# Patient Record
Sex: Male | Born: 1995 | Race: White | Hispanic: No | Marital: Single | Smoking: Never smoker
Health system: Southern US, Community
[De-identification: ages and names within clinical notes are randomized; demographics above are authoritative.]

## PROBLEM LIST (undated history)

## (undated) DIAGNOSIS — K5190 Ulcerative colitis, unspecified, without complications: Secondary | ICD-10-CM

## (undated) DIAGNOSIS — A498 Other bacterial infections of unspecified site: Secondary | ICD-10-CM

## (undated) DIAGNOSIS — L309 Dermatitis, unspecified: Secondary | ICD-10-CM

## (undated) HISTORY — PX: MOUTH SURGERY: SHX715

## (undated) HISTORY — DX: Other bacterial infections of unspecified site: A49.8

## (undated) HISTORY — PX: COLONOSCOPY W/ BIOPSIES: SHX1374

## (undated) HISTORY — DX: Ulcerative colitis, unspecified, without complications: K51.90

## (undated) HISTORY — DX: Dermatitis, unspecified: L30.9

---
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
## 2015-09-19 ENCOUNTER — Encounter (INDEPENDENT_AMBULATORY_CARE_PROVIDER_SITE_OTHER): Payer: Self-pay

## 2016-02-16 DIAGNOSIS — L259 Unspecified contact dermatitis, unspecified cause: Secondary | ICD-10-CM | POA: Diagnosis not present

## 2016-02-16 DIAGNOSIS — L219 Seborrheic dermatitis, unspecified: Secondary | ICD-10-CM | POA: Diagnosis not present

## 2017-02-15 DIAGNOSIS — L259 Unspecified contact dermatitis, unspecified cause: Secondary | ICD-10-CM | POA: Diagnosis not present

## 2017-02-15 DIAGNOSIS — L219 Seborrheic dermatitis, unspecified: Secondary | ICD-10-CM | POA: Diagnosis not present

## 2017-06-17 ENCOUNTER — Encounter: Payer: Self-pay | Admitting: Family Medicine

## 2017-06-17 ENCOUNTER — Ambulatory Visit (INDEPENDENT_AMBULATORY_CARE_PROVIDER_SITE_OTHER): Payer: BLUE CROSS/BLUE SHIELD

## 2017-06-17 ENCOUNTER — Ambulatory Visit: Payer: BLUE CROSS/BLUE SHIELD | Admitting: Family Medicine

## 2017-06-17 VITALS — BP 121/77 | HR 91 | Temp 98.1°F | Ht 65.5 in | Wt 141.1 lb

## 2017-06-17 DIAGNOSIS — K5900 Constipation, unspecified: Secondary | ICD-10-CM

## 2017-06-17 DIAGNOSIS — R109 Unspecified abdominal pain: Secondary | ICD-10-CM | POA: Diagnosis not present

## 2017-06-17 DIAGNOSIS — L2084 Intrinsic (allergic) eczema: Secondary | ICD-10-CM | POA: Diagnosis not present

## 2017-06-17 DIAGNOSIS — L309 Dermatitis, unspecified: Secondary | ICD-10-CM | POA: Insufficient documentation

## 2017-06-17 NOTE — Progress Notes (Signed)
BP 121/77   Pulse 91   Temp 98.1 F (36.7 C) (Oral)   Ht 5' 5.5" (1.664 m)   Wt 141 lb 2 oz (64 kg)   BMI 23.13 kg/m    Subjective:    Patient ID: Jonas Goh, male    DOB: Feb 24, 1996, 22 y.o.   MRN: [id number]  HPI: Armany Mano is a 22 y.o. male presenting on 06/17/2017 for Establish Care (constipation x 1 month plus, noticing some blood in stool, lower abdominal pain; taking Simethicone; mom concerned about IBS, diverticulitis, hemorrhoids)   HPI Constipation and blood in stool Patient comes in as a new patient to establish care with our office.  He has been having a lot of issues with constipation over the past month and recently over the past couple days has been noticing some bright red blood in his stool and he thinks he starting to have some hemorrhoids.  Patient has been taking simethicone and mom is concerned about possible IBS or diverticulitis or hemorrhoids.  She has a personal history of irritable bowel syndrome.  He denies having any abdominal pain but does feel some bloating and indigestion.  He denies any fevers or chills or nausea or vomiting.  He has been eating and drinking normally.  Patient has chronic severe eczema and sees a dermatologist for this.  Relevant past medical, surgical, family and social history reviewed and updated as indicated. Interim medical history since our last visit reviewed. Allergies and medications reviewed and updated.  Review of Systems  Constitutional: Negative for chills and fever.  Respiratory: Negative for shortness of breath and wheezing.   Cardiovascular: Negative for chest pain and leg swelling.  Gastrointestinal: Positive for abdominal distention and constipation. Negative for abdominal pain, diarrhea, nausea and vomiting.  Musculoskeletal: Negative for back pain and gait problem.  Skin: Positive for rash. Negative for color change.  All other systems reviewed and are negative.   Per HPI unless specifically indicated  above  Social History   Socioeconomic History  . Marital status: Single    Spouse name: Not on file  . Number of children: Not on file  . Years of education: Not on file  . Highest education level: Not on file  Social Needs  . Financial resource strain: Not on file  . Food insecurity - worry: Not on file  . Food insecurity - inability: Not on file  . Transportation needs - medical: Not on file  . Transportation needs - non-medical: Not on file  Occupational History  . Not on file  Tobacco Use  . Smoking status: Never Smoker  . Smokeless tobacco: Never Used  Substance and Sexual Activity  . Alcohol use: No  . Drug use: No  . Sexual activity: Not Currently  Other Topics Concern  . Not on file  Social History Narrative  . Not on file    Past Surgical History:  Procedure Laterality Date  . MOUTH SURGERY      Family History  Problem Relation Age of Onset  . Seizures Mother   . Bipolar disorder Mother   . Hyperlipidemia Father   . Bipolar disorder Brother   . Anxiety disorder Brother     Allergies as of 06/17/2017      Reactions   Eggs Or Egg-derived Products    AND MAYONNAISE   Peanut Butter Flavor    Peanut-containing Drug Products       Medication List        Accurate as of 06/17/17  10:19 AM. Always use your most recent med list.          betamethasone dipropionate 0.05 % cream Commonly known as:  DIPROLENE Apply topically 2 (two) times daily.   diphenhydrAMINE 50 MG tablet Commonly known as:  BENADRYL Take 50 mg by mouth at bedtime as needed for itching.          Objective:    BP 121/77   Pulse 91   Temp 98.1 F (36.7 C) (Oral)   Ht 5' 5.5" (1.664 m)   Wt 141 lb 2 oz (64 kg)   BMI 23.13 kg/m   Wt Readings from Last 3 Encounters:  06/17/17 141 lb 2 oz (64 kg)  09/19/12 118 lb (53.5 kg) (3 %, Z= -1.86)*   * Growth percentiles are based on CDC (Boys, 2-20 Years) data.    Physical Exam  Constitutional: He is oriented to person, place,  and time. He appears well-developed and well-nourished. No distress.  Eyes: Conjunctivae are normal. No scleral icterus.  Neck: Neck supple. No thyromegaly present.  Cardiovascular: Normal rate, regular rhythm, normal heart sounds and intact distal pulses.  No murmur heard. Pulmonary/Chest: Effort normal and breath sounds normal. No respiratory distress. He has no wheezes.  Abdominal: Soft. Bowel sounds are normal. He exhibits no distension. There is tenderness (Diffuse mild tenderness, probable firmness down and lower abdomen consistent with palpable stool). There is no rebound and no CVA tenderness.  Musculoskeletal: Normal range of motion. He exhibits no edema.  Lymphadenopathy:    He has no cervical adenopathy.  Neurological: He is alert and oriented to person, place, and time. Coordination normal.  Skin: Skin is warm and dry. No rash noted. He is not diaphoretic.  Psychiatric: He has a normal mood and affect. His behavior is normal.  Nursing note and vitals reviewed.   KUB: Moderate to significant stool in colon    Assessment & Plan:   Problem List Items Addressed This Visit      Musculoskeletal and Integument   Eczema    Other Visit Diagnoses    Constipation, unspecified constipation type    -  Primary   Relevant Orders   DG Abd 1 View (Completed)       Follow up plan: Return if symptoms worsen or fail to improve.  Caryl Pina, MD [facility] 06/17/2017, 10:19 AM

## 2017-07-01 ENCOUNTER — Telehealth: Payer: Self-pay | Admitting: Family Medicine

## 2017-07-01 NOTE — Telephone Encounter (Signed)
Lm 2/1-jhb

## 2017-07-07 NOTE — Telephone Encounter (Signed)
Pt has appt 07/14/17 for recheck, will discuss then

## 2017-07-07 NOTE — Telephone Encounter (Signed)
LMTCB

## 2017-07-14 ENCOUNTER — Encounter: Payer: Self-pay | Admitting: Family Medicine

## 2017-07-14 ENCOUNTER — Ambulatory Visit: Payer: BLUE CROSS/BLUE SHIELD | Admitting: Family Medicine

## 2017-07-14 ENCOUNTER — Telehealth: Payer: Self-pay | Admitting: Family Medicine

## 2017-07-14 ENCOUNTER — Ambulatory Visit (HOSPITAL_COMMUNITY)
Admission: RE | Admit: 2017-07-14 | Discharge: 2017-07-14 | Disposition: A | Discharge status: Home / Self Care | Payer: BLUE CROSS/BLUE SHIELD | Source: Ambulatory Visit | Attending: Family Medicine | Admitting: Family Medicine

## 2017-07-14 VITALS — BP 113/74 | HR 97 | Temp 97.3°F | Ht 65.5 in | Wt 127.0 lb

## 2017-07-14 DIAGNOSIS — R634 Abnormal weight loss: Secondary | ICD-10-CM

## 2017-07-14 DIAGNOSIS — K625 Hemorrhage of anus and rectum: Secondary | ICD-10-CM

## 2017-07-14 DIAGNOSIS — K6389 Other specified diseases of intestine: Secondary | ICD-10-CM | POA: Diagnosis not present

## 2017-07-14 DIAGNOSIS — R197 Diarrhea, unspecified: Secondary | ICD-10-CM | POA: Diagnosis not present

## 2017-07-14 DIAGNOSIS — R1084 Generalized abdominal pain: Secondary | ICD-10-CM

## 2017-07-14 DIAGNOSIS — R109 Unspecified abdominal pain: Secondary | ICD-10-CM | POA: Diagnosis not present

## 2017-07-14 DIAGNOSIS — R1030 Lower abdominal pain, unspecified: Secondary | ICD-10-CM

## 2017-07-14 LAB — CMP14+EGFR
A/G RATIO: 1.3 (ref 1.2–2.2)
ALT: 8 IU/L (ref 0–44)
AST: 9 IU/L (ref 0–40)
Albumin: 3.9 g/dL (ref 3.5–5.5)
Alkaline Phosphatase: 67 IU/L (ref 39–117)
BILIRUBIN TOTAL: 0.5 mg/dL (ref 0.0–1.2)
BUN / CREAT RATIO: 8 — AB (ref 9–20)
BUN: 8 mg/dL (ref 6–20)
CALCIUM: 9.6 mg/dL (ref 8.7–10.2)
CHLORIDE: 96 mmol/L (ref 96–106)
CO2: 26 mmol/L (ref 20–29)
Creatinine, Ser: 1.02 mg/dL (ref 0.76–1.27)
GFR calc Af Amer: 118 mL/min/{1.73_m2} (ref >59)
GFR, EST NON AFRICAN AMERICAN: 102 mL/min/{1.73_m2} (ref >59)
Globulin, Total: 3 g/dL (ref 1.5–4.5)
Glucose: 109 mg/dL — ABNORMAL HIGH (ref 65–99)
POTASSIUM: 5 mmol/L (ref 3.5–5.2)
Sodium: 132 mmol/L — ABNORMAL LOW (ref 134–144)
TOTAL PROTEIN: 6.9 g/dL (ref 6.0–8.5)

## 2017-07-14 LAB — CBC WITH DIFFERENTIAL/PLATELET
BASOS ABS: 0 10*3/uL (ref 0.0–0.2)
BASOS: 0 %
EOS (ABSOLUTE): 0.1 10*3/uL (ref 0.0–0.4)
EOS: 1 %
HEMATOCRIT: 39.8 % (ref 37.5–51.0)
Hemoglobin: 13.7 g/dL (ref 13.0–17.7)
LYMPHS: 22 %
Lymphocytes Absolute: 1.5 10*3/uL (ref 0.7–3.1)
MCH: 29.2 pg (ref 26.6–33.0)
MCHC: 34.4 g/dL (ref 31.5–35.7)
MCV: 85 fL (ref 79–97)
MONOCYTES: 18 %
Monocytes Absolute: 1.2 10*3/uL — ABNORMAL HIGH (ref 0.1–0.9)
NEUTROS PCT: 35 %
Neutrophils Absolute: 4.1 10*3/uL (ref 1.4–7.0)
PLATELETS: 330 10*3/uL (ref 150–379)
RBC: 4.69 x10E6/uL (ref 4.14–5.80)
RDW: 13.9 % (ref 12.3–15.4)
WBC: 6.9 10*3/uL (ref 3.4–10.8)

## 2017-07-14 LAB — IMMATURE CELLS: BANDS PCT: 24 %

## 2017-07-14 MED ORDER — IOPAMIDOL (ISOVUE-300) INJECTION 61%
100.0000 mL | Freq: Once | INTRAVENOUS | Status: AC | PRN
  Administered 2017-07-14: 100 mL via INTRAVENOUS

## 2017-07-14 MED ORDER — ACETAMINOPHEN-CODEINE #3 300-30 MG PO TABS: 1.0000 | ORAL_TABLET | ORAL | 0 refills | Status: DC | PRN

## 2017-07-14 MED ORDER — KETOROLAC TROMETHAMINE 60 MG/2ML IM SOLN
60.0000 mg | Freq: Once | INTRAMUSCULAR | Status: AC
  Administered 2017-07-14: 60 mg via INTRAMUSCULAR

## 2017-07-14 MED ORDER — CIPROFLOXACIN HCL 500 MG PO TABS: 500.0000 mg | ORAL_TABLET | Freq: Two times a day (BID) | ORAL | 0 refills | Status: DC

## 2017-07-14 MED ORDER — METRONIDAZOLE 500 MG PO TABS
500.0000 mg | ORAL_TABLET | Freq: Two times a day (BID) | ORAL | 0 refills | Status: DC
Start: 2017-07-14 — End: 2017-07-27

## 2017-07-14 NOTE — Telephone Encounter (Signed)
Lmtcb, placed excuse letter at front for pick up.

## 2017-07-14 NOTE — Progress Notes (Signed)
BP 113/74   Pulse 97   Temp (!) 97.3 F (36.3 C) (Oral)   Ht 5' 5.5" (1.664 m)   Wt 127 lb (57.6 kg)   BMI 20.81 kg/m    Subjective:    Patient ID: Christian Cardenas, male    DOB: April 02, 1996, 22 y.o.   MRN: [id number]  HPI: Christian Cardenas is a 22 y.o. male presenting on 07/14/2017 for Abdominal pain, rectal bleeding, hemorrhoids, weight loss, n (has lost 14 lbs since 06/17/17, liquid stool; has tried Zantac, Bentyl (from mom), took Miralax for a while but has stopped)   HPI Lower abdominal pain and rectal bleeding and weight loss Patient has had continued abdominal pain since last time when he was diagnosed with constipation and he took MiraLAX which did help clear up his constipation but since that time he has been having lower abdominal pain and rectal bleeding and hemorrhoids and he says he is lost 14 pounds in the past month.  He says that his stools have been liquid now and he has been trying Zantac and Bentyl and he took MiraLAX for a while but stopped when he started having loose stools.  He denies any fevers or chills.  The pain is across both sides of his lower abdomen and does not radiate anywhere else.  He says that his appetite has been decreased and he just does not been able to eat.  He rates the pain as moderate to severe in intensity.  He denies any vomiting but does feel nauseous at times.  Relevant past medical, surgical, family and social history reviewed and updated as indicated. Interim medical history since our last visit reviewed. Allergies and medications reviewed and updated.  Review of Systems  Constitutional: Positive for fatigue and unexpected weight change. Negative for chills and fever.  Respiratory: Negative for shortness of breath and wheezing.   Cardiovascular: Negative for chest pain and leg swelling.  Gastrointestinal: Positive for abdominal distention, abdominal pain, anal bleeding, blood in stool, diarrhea and nausea.  Genitourinary: Negative for  decreased urine volume, dysuria, flank pain and frequency.  Musculoskeletal: Negative for back pain and gait problem.  Skin: Negative for rash.  All other systems reviewed and are negative.   Per HPI unless specifically indicated above   Allergies as of 07/14/2017      Reactions   Eggs Or Egg-derived Products    AND MAYONNAISE   Peanut Butter Flavor    Peanut-containing Drug Products       Medication List        Accurate as of 07/14/17 11:52 AM. Always use your most recent med list.          betamethasone dipropionate 0.05 % cream Commonly known as:  DIPROLENE Apply topically 2 (two) times daily.   diphenhydrAMINE 50 MG tablet Commonly known as:  BENADRYL Take 50 mg by mouth at bedtime as needed for itching.          Objective:    BP 113/74   Pulse 97   Temp (!) 97.3 F (36.3 C) (Oral)   Ht 5' 5.5" (1.664 m)   Wt 127 lb (57.6 kg)   BMI 20.81 kg/m   Wt Readings from Last 3 Encounters:  07/14/17 127 lb (57.6 kg)  06/17/17 141 lb 2 oz (64 kg)  09/19/12 118 lb (53.5 kg) (3 %, Z= -1.86)*   * Growth percentiles are based on CDC (Boys, 2-20 Years) data.    Physical Exam  Constitutional: He is oriented to  person, place, and time. He appears well-developed and well-nourished. No distress.  Eyes: Conjunctivae are normal. No scleral icterus.  Neck: Neck supple. No thyromegaly present.  Cardiovascular: Normal rate, regular rhythm, normal heart sounds and intact distal pulses.  No murmur heard. Pulmonary/Chest: Effort normal and breath sounds normal. No respiratory distress. He has no wheezes. He has no rales.  Abdominal: Soft. Bowel sounds are normal. He exhibits no distension. There is tenderness in the right lower quadrant, suprapubic area and left lower quadrant. There is no rigidity, no rebound, no guarding and no CVA tenderness.  Musculoskeletal: Normal range of motion. He exhibits no edema.  Lymphadenopathy:    He has no cervical adenopathy.  Neurological: He  is alert and oriented to person, place, and time. Coordination normal.  Skin: Skin is warm and dry. No rash noted. He is not diaphoretic.  Psychiatric: He has a normal mood and affect. His behavior is normal.  Nursing note and vitals reviewed.   No results found for this or any previous visit.    Assessment & Plan:   Problem List Items Addressed This Visit    None    Visit Diagnoses    Lower abdominal pain    -  Primary   Relevant Medications   ketorolac (TORADOL) injection 60 mg (Completed)   Other Relevant Orders   CBC with Differential/Platelet (Completed)   CMP14+EGFR (Completed)   Ambulatory referral to Gastroenterology   Weight loss, unintentional       Relevant Medications   ketorolac (TORADOL) injection 60 mg (Completed)   Other Relevant Orders   CBC with Differential/Platelet (Completed)   CMP14+EGFR (Completed)   Ambulatory referral to Gastroenterology   Blood per rectum       Relevant Orders   CBC with Differential/Platelet (Completed)   CMP14+EGFR (Completed)   Ambulatory referral to Gastroenterology   Generalized abdominal pain       Relevant Medications   ketorolac (TORADOL) injection 60 mg (Completed)   Other Relevant Orders   CBC with Differential/Platelet (Completed)   CMP14+EGFR (Completed)   CT Abdomen Pelvis W Contrast (Completed)   Ambulatory referral to Gastroenterology   Abnormal weight loss       Relevant Orders   CBC with Differential/Platelet (Completed)   CMP14+EGFR (Completed)   CT Abdomen Pelvis W Contrast (Completed)   Ambulatory referral to Gastroenterology       Follow up plan: Return if symptoms worsen or fail to improve.  Counseling provided for all of the vaccine components Orders Placed This Encounter  Procedures  . CT Abdomen Pelvis W Contrast  . CBC with Differential/Platelet  . CMP14+EGFR  . Ambulatory referral to Gastroenterology    Caryl Pina, MD [facility] 07/14/2017, 11:52  AM

## 2017-07-14 NOTE — Telephone Encounter (Signed)
Discussed CAT scan results with patient, CT showed colitis and I sent in ciprofloxacin and metronidazole.  I have already placed gastroenterology referral and he would like the referral to be with Dr. Nathaneil Canary with [facility]. Please do a note for him and give him the rest of this week and through next week off to go back the following Monday Caryl Pina, MD [place] 07/14/2017, 4:24 PM

## 2017-07-15 ENCOUNTER — Encounter: Payer: Self-pay | Admitting: Gastroenterology

## 2017-07-15 NOTE — Telephone Encounter (Signed)
Patient aware.

## 2017-07-27 ENCOUNTER — Other Ambulatory Visit: Payer: BLUE CROSS/BLUE SHIELD

## 2017-07-27 ENCOUNTER — Ambulatory Visit: Payer: BLUE CROSS/BLUE SHIELD | Admitting: Gastroenterology

## 2017-07-27 ENCOUNTER — Encounter: Payer: Self-pay | Admitting: Gastroenterology

## 2017-07-27 VITALS — BP 104/62 | HR 100 | Ht 65.0 in | Wt 127.6 lb

## 2017-07-27 DIAGNOSIS — R197 Diarrhea, unspecified: Secondary | ICD-10-CM | POA: Diagnosis not present

## 2017-07-27 DIAGNOSIS — K625 Hemorrhage of anus and rectum: Secondary | ICD-10-CM | POA: Diagnosis not present

## 2017-07-27 DIAGNOSIS — R109 Unspecified abdominal pain: Secondary | ICD-10-CM | POA: Diagnosis not present

## 2017-07-27 MED ORDER — PEG 3350-KCL-NABCB-NACL-NASULF 236 G PO SOLR: 4000.0000 mL | Freq: Once | ORAL | 0 refills | Status: AC

## 2017-07-27 NOTE — Progress Notes (Signed)
07/27/2017 Christian Cardenas [id number] 01/07/1996   HISTORY OF PRESENT ILLNESS:  This is a 22 year old male who is new to our office and was referred here by Dr. Warrick Parisian for evaluation of abdominal pain, diarrhea, and rectal bleeding.  He tells me that he first started noticing some symptoms in mid-late December.  Started with some mild lower abdominal discomfort.  Then seemed to have some constipation but that resolved and now has been diarrhea, sometimes 4 times within 5 hours.  Having rectal bleeding with a moderate amount of blood in the toilet bowl at times.  Did have some nausea and vomiting around the time of the CT scan.  Reports around a 14 pound weight loss, but has gained a couple of pounds back.  Has cramping prior to a BM and has urgency with a lot of gas.  CT scan of the abdomen and pelvis on 07/14/17 showed mild wall and fold thickening throughout the colon suggesting infectious or inflammatory colitis.  No stool studies have been performed, but he was treated with a course of cipro and flagyl.  Symptoms still persistent, maybe slight improved with that treatment.  No other antibiotic use preceding this, no sick contacts.  Does handle chickens, live and dead, at work (works at [employer]).  No family history of UC/Crohn's, etc.  CBC and CMP ok except for a slightly low sodium at 132.    Past Medical History:  Diagnosis Date  . Eczema    Past Surgical History:  Procedure Laterality Date  . MOUTH SURGERY      reports that  has never smoked. he has never used smokeless tobacco. He reports that he does not drink alcohol or use drugs. family history includes Anxiety disorder in his brother; Bipolar disorder in his brother and mother; Hyperlipidemia in his father; Seizures in his mother. Allergies  Allergen Reactions  . Eggs Or Egg-Derived Products     AND MAYONNAISE  . Peanut Butter Flavor   . Peanut-Containing Drug Products       Outpatient Encounter Medications as of  07/27/2017  Medication Sig  . acetaminophen-codeine (TYLENOL #3) 300-30 MG tablet Take 1 tablet by mouth every 4 (four) hours as needed for moderate pain.  Marland Kitchen betamethasone dipropionate (DIPROLENE) 0.05 % cream Apply topically 2 (two) times daily.  . diphenhydrAMINE (BENADRYL) 50 MG tablet Take 50 mg by mouth at bedtime as needed for itching.  . [DISCONTINUED] ciprofloxacin (CIPRO) 500 MG tablet Take 1 tablet (500 mg total) by mouth 2 (two) times daily.  . [DISCONTINUED] metroNIDAZOLE (FLAGYL) 500 MG tablet Take 1 tablet (500 mg total) by mouth 2 (two) times daily.   No facility-administered encounter medications on file as of 07/27/2017.      REVIEW OF SYSTEMS  : All other systems reviewed and negative except where noted in the History of Present Illness.   PHYSICAL EXAM: BP 104/62   Pulse 100   Ht 5' 5"  (1.651 m)   Wt 127 lb 9.6 oz (57.9 kg)   BMI 21.23 kg/m  General: Well developed white male in no acute distress Head: Normocephalic and atraumatic Eyes:  Sclerae anicteric, conjunctiva pink. Ears: Normal auditory acuity Lungs: Clear throughout to auscultation; no increased WOB. Heart: Regular rate and rhythm; no M/R/G. Abdomen: Soft, non-distended.  BS present.  Mild diffuse TTP. Rectal:  Will be done at the time of colonoscopy. Musculoskeletal: Symmetrical with no gross deformities  Skin: No lesions on visible extremities Extremities: No edema  Neurological: Alert oriented x 4, grossly non-focal Psychological:  Alert and cooperative. Normal mood and affect  ASSESSMENT AND PLAN: *22 year old male with symptoms of abdominal pain, diarrhea, and rectal bleeding since December with CT scan showing colitis.  No stool studies performed but treated with cipro and flagyl.  Symptoms still persistent, slightly improved.  Need to rule out Cdiff but otherwise my concern is for IBD/UC.  Will check stool for Cdiff and if negative then will proceed with colonoscopy.  **The risks, benefits, and  alternatives to colonoscopy were discussed with the patient and he consents to proceed.   CC:  Dettinger, Fransisca Kaufmann, MD

## 2017-07-27 NOTE — Patient Instructions (Signed)
If you are age 24 or older, your body mass index should be between 23-30. Your Body mass index is 21.23 kg/m. If this is out of the aforementioned range listed, please consider follow up with your Primary Care Provider.  If you are age 52 or younger, your body mass index should be between 19-25. Your Body mass index is 21.23 kg/m. If this is out of the aformentioned range listed, please consider follow up with your Primary Care Provider.   You have been scheduled for a colonoscopy. Please follow written instructions given to you at your visit today.  Please pick up your prep supplies at the pharmacy within the next 1-3 days. If you use inhalers (even only as needed), please bring them with you on the day of your procedure. Your physician has requested that you go to www.startemmi.com and enter the access code given to you at your visit today. This web site gives a general overview about your procedure. However, you should still follow specific instructions given to you by our office regarding your preparation for the procedure.  Your physician has requested that you go to the basement for the following lab work before leaving today: C Diff  We have sent the following medications to your pharmacy for you to pick up at your convenience: Golytely  You may have a light breakfast the morning of prep day (the day before the procedure).   You may choose from one of the following items: eggs and toast OR chicken noodle soup and crackers.   You should have your breakfast completed between 8:00 and 9:00 am the day before your procedure.    After you have had your light breakfast you should start a clear liquid diet only, NO SOLIDS. No additional solid food is allowed. You may continue to have clear liquid up to 3 hours prior to your procedure.   Thank you for choosing me and [facility].   Alonza Bogus, PA-C

## 2017-07-28 NOTE — Progress Notes (Signed)
I agree with the above note, plan 

## 2017-07-31 LAB — CLOSTRIDIUM DIFFICILE BY PCR: Toxigenic C. Difficile by PCR: NEGATIVE

## 2017-08-01 ENCOUNTER — Telehealth: Payer: Self-pay | Admitting: Gastroenterology

## 2017-08-01 NOTE — Telephone Encounter (Signed)
Christian Cardenas have you reviewed the results ?

## 2017-08-02 NOTE — Telephone Encounter (Signed)
The patient has been notified of this information and all questions answered.

## 2017-08-02 NOTE — Telephone Encounter (Signed)
Cdiff stool study is negative.  Proceed with colonoscopy as scheduled.

## 2017-08-04 ENCOUNTER — Telehealth: Payer: Self-pay | Admitting: Gastroenterology

## 2017-08-04 MED ORDER — PEG 3350-KCL-NABCB-NACL-NASULF 236 G PO SOLR: 4000.0000 mL | Freq: Once | ORAL | 0 refills | Status: AC

## 2017-08-04 NOTE — Telephone Encounter (Signed)
Medication sent in. 

## 2017-08-29 ENCOUNTER — Encounter: Payer: Self-pay | Admitting: Gastroenterology

## 2017-08-30 ENCOUNTER — Ambulatory Visit: Payer: BLUE CROSS/BLUE SHIELD | Admitting: Family Medicine

## 2017-08-30 ENCOUNTER — Telehealth: Payer: Self-pay | Admitting: Gastroenterology

## 2017-08-30 ENCOUNTER — Encounter: Payer: Self-pay | Admitting: Family Medicine

## 2017-08-30 VITALS — BP 108/72 | HR 108 | Temp 98.7°F | Ht 65.0 in | Wt 115.0 lb

## 2017-08-30 DIAGNOSIS — R0981 Nasal congestion: Secondary | ICD-10-CM

## 2017-08-30 DIAGNOSIS — R109 Unspecified abdominal pain: Secondary | ICD-10-CM

## 2017-08-30 MED ORDER — ACETAMINOPHEN-CODEINE #3 300-30 MG PO TABS: 1.0000 | ORAL_TABLET | ORAL | 0 refills | Status: DC | PRN

## 2017-08-30 MED ORDER — PEG 3350-KCL-NA BICARB-NACL 420 G PO SOLR: 4000.0000 mL | Freq: Once | ORAL | 0 refills | Status: AC

## 2017-08-30 MED ORDER — FLUTICASONE PROPIONATE 50 MCG/ACT NA SUSP: 1.0000 | Freq: Two times a day (BID) | NASAL | 6 refills | Status: DC | PRN

## 2017-08-30 NOTE — Telephone Encounter (Signed)
Medication sent in again

## 2017-08-30 NOTE — Progress Notes (Signed)
BP 108/72   Pulse (!) 108   Temp 98.7 F (37.1 C) (Oral)   Ht 5' 5"  (1.651 m)   Wt 115 lb (52.2 kg)   BMI 19.14 kg/m    Subjective:    Patient ID: Christian Cardenas, male    DOB: [birth year], 22 y.o.   MRN: [id number]  HPI: Christian Cardenas is a 22 y.o. male presenting on 08/30/2017 for Fatigue, cough, needs work note (has appointment on 09/06/17 for colonoscopy) and Medication Refill (Tylenol #3)   HPI Intermittent sinus congestion and cough Patient is coming in complaining of intermittent sinus congestion cough is been going on over the past couple weeks.  He has been having some sneezing intermittently and has used some Mucinex for it which has helped some.  He says the cough is not all the time but just intermittently.  He denies any fevers or chills or shortness of breath or wheezing.  His cough is mostly nonproductive and dry.  He denies any sick contacts that he knows of.  Patient wants a pain medication refill of Tylenol 3 because of his abdominal pain.  He still has an appointment slotted to go see colonoscopy in 7 days.  Relevant past medical, surgical, family and social history reviewed and updated as indicated. Interim medical history since our last visit reviewed. Allergies and medications reviewed and updated.  Review of Systems  Constitutional: Negative for chills and fever.  HENT: Positive for congestion, sinus pressure and sneezing. Negative for ear discharge, ear pain, postnasal drip, rhinorrhea, sore throat and voice change.   Eyes: Negative for pain, discharge, redness and visual disturbance.  Respiratory: Positive for cough. Negative for shortness of breath and wheezing.   Cardiovascular: Negative for chest pain and leg swelling.  Musculoskeletal: Negative for back pain and gait problem.  Skin: Negative for rash.  Neurological: Positive for headaches.  All other systems reviewed and are negative.   Per HPI unless specifically indicated above   Allergies as of  08/30/2017      Reactions   Other Anaphylaxis   Eggs Or Egg-derived Products    AND MAYONNAISE   Peanut Butter Flavor    Peanut-containing Drug Products       Medication List        Accurate as of 08/30/17  2:52 PM. Always use your most recent med list.          acetaminophen-codeine 300-30 MG tablet Commonly known as:  TYLENOL #3 Take 1 tablet by mouth every 4 (four) hours as needed for moderate pain.   betamethasone dipropionate 0.05 % cream Commonly known as:  DIPROLENE Apply topically 2 (two) times daily.   diphenhydrAMINE 50 MG tablet Commonly known as:  BENADRYL Take 50 mg by mouth at bedtime as needed for itching.   triamcinolone cream 0.1 % Commonly known as:  KENALOG          Objective:    BP 108/72   Pulse (!) 108   Temp 98.7 F (37.1 C) (Oral)   Ht 5' 5"  (1.651 m)   Wt 115 lb (52.2 kg)   BMI 19.14 kg/m   Wt Readings from Last 3 Encounters:  08/30/17 115 lb (52.2 kg)  07/27/17 127 lb 9.6 oz (57.9 kg)  07/14/17 127 lb (57.6 kg)    Physical Exam  Constitutional: He is oriented to person, place, and time. He appears well-developed and well-nourished. No distress.  HENT:  Right Ear: Tympanic membrane, external ear and ear canal normal.  Left  Ear: Tympanic membrane, external ear and ear canal normal.  Nose: No mucosal edema, rhinorrhea or sinus tenderness. No epistaxis. Right sinus exhibits no maxillary sinus tenderness and no frontal sinus tenderness. Left sinus exhibits no maxillary sinus tenderness and no frontal sinus tenderness.  Mouth/Throat: Uvula is midline and mucous membranes are normal. No oropharyngeal exudate, posterior oropharyngeal edema, posterior oropharyngeal erythema or tonsillar abscesses.  Eyes: Pupils are equal, round, and reactive to light. Conjunctivae and EOM are normal. Right eye exhibits no discharge. No scleral icterus.  Neck: Neck supple. No thyromegaly present.  Cardiovascular: Normal rate, regular rhythm, normal heart sounds  and intact distal pulses.  No murmur heard. Pulmonary/Chest: Effort normal and breath sounds normal. No respiratory distress. He has no wheezes. He has no rales.  Abdominal: There is generalized tenderness. There is no rigidity, no rebound, no guarding and no CVA tenderness.  Musculoskeletal: Normal range of motion. He exhibits no edema.  Lymphadenopathy:    He has no cervical adenopathy.  Neurological: He is alert and oriented to person, place, and time. Coordination normal.  Skin: Skin is warm and dry. No rash noted. He is not diaphoretic.  Psychiatric: He has a normal mood and affect. His behavior is normal.  Nursing note and vitals reviewed.       Assessment & Plan:   Problem List Items Addressed This Visit      Other   Abdominal pain    Other Visit Diagnoses    Sinus congestion    -  Primary   Relevant Medications   fluticasone (FLONASE) 50 MCG/ACT nasal spray       Follow up plan: Return if symptoms worsen or fail to improve.  Counseling provided for all of the vaccine components No orders of the defined types were placed in this encounter.   Caryl Pina, MD [facility] 08/30/2017, 2:52 PM

## 2017-09-01 NOTE — Telephone Encounter (Signed)
Patient calling in wanting nurse to disregard previous message. He says pharmacy was able to process rx and was able to fill med

## 2017-09-01 NOTE — Telephone Encounter (Signed)
Patient calling again stating that pharmacy has still not received prep for colon this Tuesday 4.9.19. Patient wanting to know if it can be sent again and called when completed or if it is not going through to try the [facility] in [place].

## 2017-09-01 NOTE — Telephone Encounter (Signed)
Noted  

## 2017-09-06 ENCOUNTER — Encounter: Payer: Self-pay | Admitting: Gastroenterology

## 2017-09-06 ENCOUNTER — Other Ambulatory Visit: Payer: Self-pay

## 2017-09-06 ENCOUNTER — Ambulatory Visit (AMBULATORY_SURGERY_CENTER): Payer: BLUE CROSS/BLUE SHIELD | Admitting: Gastroenterology

## 2017-09-06 DIAGNOSIS — K52832 Lymphocytic colitis: Secondary | ICD-10-CM | POA: Diagnosis not present

## 2017-09-06 DIAGNOSIS — R109 Unspecified abdominal pain: Secondary | ICD-10-CM

## 2017-09-06 DIAGNOSIS — K5100 Ulcerative (chronic) pancolitis without complications: Secondary | ICD-10-CM

## 2017-09-06 DIAGNOSIS — K5190 Ulcerative colitis, unspecified, without complications: Secondary | ICD-10-CM | POA: Diagnosis not present

## 2017-09-06 DIAGNOSIS — K625 Hemorrhage of anus and rectum: Secondary | ICD-10-CM | POA: Diagnosis not present

## 2017-09-06 MED ORDER — PREDNISONE 10 MG PO TABS: ORAL_TABLET | ORAL | 1 refills | Status: DC

## 2017-09-06 MED ORDER — SODIUM CHLORIDE 0.9 % IV SOLN
500.0000 mL | Freq: Once | INTRAVENOUS | Status: DC
Start: 2017-09-06 — End: 2018-02-17

## 2017-09-06 NOTE — Progress Notes (Signed)
To PACU, VSS. Report to RN.tb 

## 2017-09-06 NOTE — Op Note (Signed)
[facility] Patient Name: Christian Cardenas Procedure Date: 09/06/2017 2:02 PM MRN: [id number] Endoscopist: Milus Banister , MD Age: 22 Referring MD:  Date of Birth: 1996/01/18 Gender: Male Account #: [id number] Procedure:                Colonoscopy Indications:              Chronic diarrhea, bleeding intermittently, weight                            loss; pan-colitis on CT scan Medicines:                Fentanyl 150 micrograms IV, Midazolam 8 mg IV Procedure:                Pre-Anesthesia Assessment:                           - Prior to the procedure, a History and Physical                            was performed, and patient medications and                            allergies were reviewed. The patient's tolerance of                            previous anesthesia was also reviewed. The risks                            and benefits of the procedure and the sedation                            options and risks were discussed with the patient.                            All questions were answered, and informed consent                            was obtained. Prior Anticoagulants: The patient has                            taken no previous anticoagulant or antiplatelet                            agents. ASA Grade Assessment: II - A patient with                            mild systemic disease. After reviewing the risks                            and benefits, the patient was deemed in                            satisfactory condition to undergo the procedure.  After obtaining informed consent, the colonoscope                            was passed under direct vision. Throughout the                            procedure, the patient's blood pressure, pulse, and                            oxygen saturations were monitored continuously. The                            colonoscopy was performed without difficulty. The                            patient  tolerated the procedure well. The entire                            colon was examined. The Colonoscope was introduced                            through the anus and advanced to the the terminal                            ileum. Scope In: 2:23:47 PM Scope Out: 1:88:41 PM Scope Withdrawal Time: 0 hours 9 minutes 52 seconds  Total Procedure Duration: 0 hours 13 minutes 2 seconds  Findings:                 The terminal ileum appeared normal. Biopsies were                            taken with a cold forceps for histology.                           There was moderate to severe inflammation                            throughout the colon (no rectal sparing); friable,                            ulcerated (some linear ulcerations) and edematous.                            This was biopsied (right colon and left colon) and                            sent to pathology.                           The exam was otherwise without abnormality on                            direct and retroflexion views. Complications:  No immediate complications. Estimated blood loss:                            None. Estimated Blood Loss:     Estimated blood loss: none. 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 I suspect this                            represents IBD (UC>Crohn's).                           - Await final pathology results. Recommendation:           - Patient has a contact number available for                            emergencies. The signs and symptoms of potential                            delayed complications were discussed with the                            patient. Return to normal activities tomorrow.                            Written discharge instructions were provided to the                            patient.                           - Resume previous diet.                           - Continue present medications. Please start                             prednisone; 36m pills, take 4 pills once daily for                            now (total 419mper day), disp 100, one refill.                           - Return office visit with Dr. JaArdis Hughsr extender                            in 3-4 weeks.                           - Await pathology results. Will discuss steroid                            tapering regimen and will start steroid sparing  medicine when pathology results are back; I favor                            full strength mesalamine trial first. Milus Banister, MD 09/06/2017 2:46:47 PM This report has been signed electronically.

## 2017-09-06 NOTE — Progress Notes (Signed)
Called to room to assist during endoscopic procedure.  Patient ID and intended procedure confirmed with present staff. Received instructions for my participation in the procedure from the performing physician.  

## 2017-09-06 NOTE — Progress Notes (Signed)
Gershon Crane, CRNA, notified of egg allergy.

## 2017-09-06 NOTE — Patient Instructions (Signed)
YOU HAD AN ENDOSCOPIC PROCEDURE TODAY AT [facility]:   Refer to the procedure report that was given to you for any specific questions about what was found during the examination.  If the procedure report does not answer your questions, please call your gastroenterologist to clarify.  If you requested that your care partner not be given the details of your procedure findings, then the procedure report has been included in a sealed envelope for you to review at your convenience later.  YOU SHOULD EXPECT: Some feelings of bloating in the abdomen. Passage of more gas than usual.  Walking can help get rid of the air that was put into your GI tract during the procedure and reduce the bloating. If you had a lower endoscopy (such as a colonoscopy or flexible sigmoidoscopy) you may notice spotting of blood in your stool or on the toilet paper. If you underwent a bowel prep for your procedure, you may not have a normal bowel movement for a few days.  Please Note:  You might notice some irritation and congestion in your nose or some drainage.  This is from the oxygen used during your procedure.  There is no need for concern and it should clear up in a day or so.  SYMPTOMS TO REPORT IMMEDIATELY:   Following lower endoscopy (colonoscopy or flexible sigmoidoscopy):  Excessive amounts of blood in the stool  Significant tenderness or worsening of abdominal pains  Swelling of the abdomen that is new, acute  Fever of 100F or higher  For urgent or emergent issues, a gastroenterologist can be reached at any hour by calling [phone].   DIET:  We do recommend a small meal at first, but then you may proceed to your regular diet.  Drink plenty of fluids but you should avoid alcoholic beverages for 24 hours. Try to decease the milk products in your diet.  Eat plenty of protein.  Try to avoid advil and aspirin. Maybe cut back on fiber.  ACTIVITY:  You should plan to take it easy for the rest of  today and you should NOT DRIVE or use heavy machinery until tomorrow (because of the sedation medicines used during the test).    FOLLOW UP: Our staff will call the number listed on your records the next business day following your procedure to check on you and address any questions or concerns that you may have regarding the information given to you following your procedure. If we do not reach you, we will leave a message.  However, if you are feeling well and you are not experiencing any problems, there is no need to return our call.  We will assume that you have returned to your regular daily activities without incident.  If any biopsies were taken you will be contacted by phone or by letter within the next 1-3 weeks.  Please call us at [phone] if you have not heard about the biopsies in 3 weeks.    SIGNATURES/CONFIDENTIALITY: You and/or your care partner have signed paperwork which will be entered into your electronic medical record.  These signatures attest to the fact that that the information above on your After Visit Summary has been reviewed and is understood.  Full responsibility of the confidentiality of this discharge information lies with you and/or your care-partner.  Read all of the handouts given to you by your recovery room nurse.

## 2017-09-06 NOTE — Progress Notes (Signed)
Pt's states no medical or surgical changes since previsit or office visit. 

## 2017-09-07 ENCOUNTER — Telehealth: Payer: Self-pay | Admitting: *Deleted

## 2017-09-07 NOTE — Telephone Encounter (Signed)
  Follow up Call-  Call back number 09/06/2017  Post procedure Call Back phone  # [phone]  Permission to leave phone message Yes  Some recent data might be hidden     Patient questions:  Do you have a fever, pain , or abdominal swelling? No. Pain Score  0 *  Have you tolerated food without any problems? Yes.    Have you been able to return to your normal activities? Yes.    Do you have any questions about your discharge instructions: Diet   Yes.   Medications  No. Follow up visit  No.  Do you have questions or concerns about your Care? No.  Actions: * If pain score is 4 or above: No action needed, pain <4.

## 2017-09-13 ENCOUNTER — Telehealth: Payer: Self-pay | Admitting: Gastroenterology

## 2017-09-13 NOTE — Telephone Encounter (Signed)
See results note from today.

## 2017-09-13 NOTE — Telephone Encounter (Signed)
Dr Ardis Hughs have you reviewed the path?

## 2017-10-11 ENCOUNTER — Ambulatory Visit: Payer: BLUE CROSS/BLUE SHIELD | Admitting: Gastroenterology

## 2017-10-11 ENCOUNTER — Encounter: Payer: Self-pay | Admitting: Gastroenterology

## 2017-10-11 VITALS — BP 118/78 | HR 78 | Ht 65.0 in | Wt 121.0 lb

## 2017-10-11 DIAGNOSIS — K5100 Ulcerative (chronic) pancolitis without complications: Secondary | ICD-10-CM | POA: Diagnosis not present

## 2017-10-11 MED ORDER — MESALAMINE 1.2 G PO TBEC: 4.8000 g | DELAYED_RELEASE_TABLET | Freq: Every day | ORAL | 2 refills | Status: DC

## 2017-10-11 NOTE — Progress Notes (Signed)
10/11/2017 Christian Cardenas [id number] 10/20/1995   HISTORY OF PRESENT ILLNESS:  Patient was seen on 07/27/2017 by me and had colonoscopy on 09/06/2017 by Dr. Ardis Hughs, diagnosed with pancolitis, UC.  Was started on 40 mg of prednisone at the time of colonoscopy and was told to continue that for three more weeks after biopsy results then to taper by 10 mg weekly.  It sounds like he tapered sooner than that and is only on 10 mg and should be on 20 mg as of today (should have been on 30 mg until yesterday).  Anyway, he is definitely feeling better.  Having 1-3 BMs daily with some form.  No nocturnal BM's.  Still with some small amounts of blood if he strains too hard.  Much less abdominal pain, more just gas discomfort.  He says that his weight was down to 113 pounds so he was glad to see that it was up to 121 today.      Past Medical History:  Diagnosis Date  . Eczema   . Ulcerative colitis [facility])    Past Surgical History:  Procedure Laterality Date  . COLONOSCOPY W/ BIOPSIES    . MOUTH SURGERY      reports that he has never smoked. He has never used smokeless tobacco. He reports that he does not drink alcohol or use drugs. family history includes Anxiety disorder in his brother; Bipolar disorder in his brother and mother; Hyperlipidemia in his father; Seizures in his mother. Allergies  Allergen Reactions  . Other Anaphylaxis  . Eggs Or Egg-Derived Products     AND MAYONNAISE  . Peanut Butter Flavor   . Peanut-Containing Drug Products       Outpatient Encounter Medications as of 10/11/2017  Medication Sig  . acetaminophen-codeine (TYLENOL #3) 300-30 MG tablet Take 1 tablet by mouth every 4 (four) hours as needed for moderate pain.  Marland Kitchen betamethasone dipropionate (DIPROLENE) 0.05 % cream Apply topically 2 (two) times daily.  . diphenhydrAMINE (BENADRYL) 50 MG tablet Take 50 mg by mouth at bedtime as needed for itching.  . fluticasone (FLONASE) 50 MCG/ACT nasal spray Place 1 spray into  both nostrils 2 (two) times daily as needed for allergies or rhinitis.  . predniSONE (DELTASONE) 10 MG tablet Take 10 mg by mouth daily with breakfast.  . triamcinolone cream (KENALOG) 0.1 %   . [DISCONTINUED] predniSONE (DELTASONE) 10 MG tablet Take 4 tablets once a day with food for now. (Patient taking differently: Take 1 tablets once a day with food for now.)   Facility-Administered Encounter Medications as of 10/11/2017  Medication  . 0.9 %  sodium chloride infusion     REVIEW OF SYSTEMS  : All other systems reviewed and negative except where noted in the History of Present Illness.   PHYSICAL EXAM: BP 118/78   Pulse 78   Ht 5' 5"  (1.651 m)   Wt 121 lb (54.9 kg)   BMI 20.14 kg/m  General: Well developed white male in no acute distress Head: Normocephalic and atraumatic Eyes:  Sclerae anicteric, conjunctiva pink. Ears: Normal auditory acuity. Lungs: Clear throughout to auscultation; no increased WOB. Heart: Regular rate and rhythm; no M/R/G. Abdomen: Soft, non-distended.  BS present.  Non-tender. Musculoskeletal: Symmetrical with no gross deformities  Skin: No lesions on visible extremities Extremities: No edema  Neurological: Alert oriented x 4, grossly non-focal Psychological:  Alert and cooperative. Normal mood and affect  ASSESSMENT AND PLAN: *UC, pancolitis:  Much improved on prednisone.  Sounds like  he started taper sooner than we planned but still doing better.  Will start Lialda 4.8 grams daily.  Will continue prednisone 10 mg daily for 3 more weeks to allow the Lialda to kick in.  Will call back in about one month with an update and will get him a follow-up in place for 3-4 months from now with anticipation that he does well with mesalamine.   CC:  Dettinger, Fransisca Kaufmann, MD

## 2017-10-11 NOTE — Patient Instructions (Signed)
Continue Prednisone 10 mg daily for 3 more weeks.   We have sent the following medications to your pharmacy for you to pick up at your convenience: Lialda 4.8 grams daily   Please call the office with an update of your symptoms in a month and ask for Koren Shiver, RN, BSN.

## 2017-10-11 NOTE — Progress Notes (Signed)
I agree with the above note, plan.  Thanks Jess.

## 2017-11-07 ENCOUNTER — Telehealth: Payer: Self-pay | Admitting: Gastroenterology

## 2017-11-07 DIAGNOSIS — K5100 Ulcerative (chronic) pancolitis without complications: Secondary | ICD-10-CM

## 2017-11-08 NOTE — Telephone Encounter (Signed)
Left message on machine to call back  

## 2017-11-08 NOTE — Telephone Encounter (Signed)
Pt has been taking Lialda 4.8 g daily for the past 4 weeks, and stopped prednisone 1 week ago.  Loose stools becoming an issue and a lot of gas within several days of stopping prednisone.  Some bleeding with loose stools.  Some urgency.  He is not sure if he wants to continue lialda and has only enough to cover tomorrow.  Please advise

## 2017-11-09 NOTE — Telephone Encounter (Signed)
Patient states if he hasn't heard anything by tomorrow he is going to have to start taking the prednisone due to him having accidents.

## 2017-11-09 NOTE — Telephone Encounter (Signed)
Dr Ardis Hughs please advise the pt has called back for req's

## 2017-11-09 NOTE — Telephone Encounter (Signed)
Dr. Ardis Hughs, can I get your input on this?  Should I restart prednisone.  He had pretty severe disease.  ? If he is going to need a biologic.

## 2017-11-10 ENCOUNTER — Other Ambulatory Visit: Payer: BLUE CROSS/BLUE SHIELD

## 2017-11-10 DIAGNOSIS — K5100 Ulcerative (chronic) pancolitis without complications: Secondary | ICD-10-CM

## 2017-11-10 NOTE — Telephone Encounter (Signed)
Is failing mesalamine monotherapy.  He needs to restart prednisone at 64m daily. No taper for now.  He needs stool sent for C. Diff by PCR and toxin, also GI pathogen panel.  He needs labs for tb gold, hepatitis B surface Ab, hepatitis B surface antigen, Hepatitis C Ab, HIV, varicella Ab, TPMT enzyme level.  He neesd referral to ID clinic to help get him up to date of all necessary vaccinations and will work towards new start of biologic. He needs ROV with me in next 1-2 weeks to discuss this further, decide on biologic.   thanks

## 2017-11-10 NOTE — Telephone Encounter (Signed)
The pt has been advised of the information and verbalized understanding.   The pt will call if he has not heard from ID in 1 week. He will come in for labs in the next day or two.

## 2017-11-10 NOTE — Addendum Note (Signed)
Addended by: Isaiah Serge D on: 11/10/2017 03:11 PM   Modules accepted: Orders

## 2017-11-10 NOTE — Telephone Encounter (Signed)
ID referral in Epic and ROV scheduled for 11/15/17 930 am, labs in Epic and pt notified to restart prednisone

## 2017-11-11 LAB — VARICELLA ZOSTER ABS, IGG/IGM: Varicella zoster IgG: 1381 index (ref >165)

## 2017-11-15 ENCOUNTER — Other Ambulatory Visit: Payer: Self-pay

## 2017-11-15 ENCOUNTER — Encounter: Payer: Self-pay | Admitting: Gastroenterology

## 2017-11-15 ENCOUNTER — Other Ambulatory Visit: Payer: BLUE CROSS/BLUE SHIELD

## 2017-11-15 ENCOUNTER — Ambulatory Visit: Payer: BLUE CROSS/BLUE SHIELD | Admitting: Gastroenterology

## 2017-11-15 VITALS — BP 120/62 | HR 74 | Ht 65.0 in | Wt 126.4 lb

## 2017-11-15 DIAGNOSIS — K5100 Ulcerative (chronic) pancolitis without complications: Secondary | ICD-10-CM

## 2017-11-15 DIAGNOSIS — Z23 Encounter for immunization: Secondary | ICD-10-CM

## 2017-11-15 LAB — QUANTIFERON-TB GOLD PLUS
MITOGEN-NIL: 3.38 [IU]/mL
NIL: 0.29 [IU]/mL
QUANTIFERON-TB GOLD PLUS: NEGATIVE
TB1-NIL: 0.01 IU/mL
TB2-NIL: 0.03 [IU]/mL

## 2017-11-15 LAB — HEPATITIS C ANTIBODY
Hepatitis C Ab: NONREACTIVE
SIGNAL TO CUT-OFF: 0.02 (ref <1.00)

## 2017-11-15 LAB — HEPATITIS B SURFACE ANTIGEN: Hepatitis B Surface Ag: NONREACTIVE

## 2017-11-15 LAB — HIV ANTIBODY (ROUTINE TESTING W REFLEX): HIV: NONREACTIVE

## 2017-11-15 LAB — THIOPURINE METHYLTRANSFERASE (TPMT), RBC: THIOPURINE METHYLTRANSFERASE, RBC: 13 nmol/h/mL

## 2017-11-15 LAB — HEPATITIS B SURFACE ANTIBODY,QUALITATIVE: Hep B S Ab: NONREACTIVE

## 2017-11-15 MED ORDER — PNEUMOCOCCAL 13-VAL CONJ VACC IM SUSP
0.5000 mL | Freq: Once | INTRAMUSCULAR | Status: AC
  Administered 2017-11-15: 0.5 mL via INTRAMUSCULAR

## 2017-11-15 NOTE — Patient Instructions (Addendum)
Entyvio new start (372m IV day zero, then 3066mIV week 2, then 30093mV week 6 and then 300m71m every 8 weeks).  Pneumococcus immunization (prevnar 13 now and then prevnar 23 in 8-10 weeks). Today.  HPV immunization is recommended; please contact your PCP about this. We don't have this here in our office.  Please return to see Dr. JacoArdis Hughs6 weeks. STay on prednisone 40mg79mly for now, can start SLOW taper after your first entyvio injection.  Await stool test results.  Normal BMI (Body Mass Index- based on height and weight) is between 19 and 25. Your BMI today is Body mass index is 21.03 kg/m. . PleMarland Kitchense consider follow up  regarding your BMI with your Primary Care Provider.

## 2017-11-15 NOTE — Progress Notes (Signed)
Review of pertinent gastrointestinal problems: 1. Extensive UC: Presented with abdominal pain, bloody diarrhea February 2019.  CT scan showed pan colonic inflammation.  Colonoscopy, Dr. Ardis Hughs April 2019 showed moderate to severe inflammation throughout his colon.  Biopsies from this confirmed inflammatory bowel disease without granulomas.  There was no rectal sparing.  The terminal ileum was normal.  He responded to prednisone and was started on oral full strength mesalamine.  10/2017 labs: Hepatitis B surface antigen and antibody negative, Hepatitis C Ab negative, HIV negative, TB quant gold negative.     HPI: This is a very pleasant 22 year old man who is here with his mother today.  I last saw him at the time of colonoscopy about 2 months ago.  He responded very well to prednisone 40 mg once daily and then started full strength oral mesalamine, Lialda 4 pills once daily.  As he tapered off the prednisone his abdominal cramping, bloody diarrhea returned.  He cannot recall specific dose of the prednisone where his symptoms return but he said clearly by the time he was off of the prednisone completely he had colitis symptoms again. Last week he was Restarted on prednisone 40 mg daily and his symptoms again responded quickly.  He stopped mesalamine.  he has had TB testing, hepatitis B and C testing, HIV, CL that summarized above   Chief complaint is extensive, moderate to severe ulcerative colitis  ROS: complete GI ROS as described in HPI, all other review negative.  Constitutional:  No unintentional weight loss   Past Medical History:  Diagnosis Date  . Eczema   . Ulcerative colitis [facility])     Past Surgical History:  Procedure Laterality Date  . COLONOSCOPY W/ BIOPSIES    . MOUTH SURGERY      Current Outpatient Medications  Medication Sig Dispense Refill  . betamethasone dipropionate (DIPROLENE) 0.05 % cream Apply topically 2 (two) times daily. 60 g 1  . diphenhydrAMINE  (BENADRYL) 50 MG tablet Take 50 mg by mouth at bedtime as needed for itching.    . fluticasone (FLONASE) 50 MCG/ACT nasal spray Place 1 spray into both nostrils 2 (two) times daily as needed for allergies or rhinitis. 16 g 6  . predniSONE (DELTASONE) 10 MG tablet Take 10 mg by mouth daily with breakfast.    . triamcinolone cream (KENALOG) 0.1 %   1   Current Facility-Administered Medications  Medication Dose Route Frequency Provider Last Rate Last Dose  . 0.9 %  sodium chloride infusion  500 mL Intravenous Once Milus Banister, MD        Allergies as of 11/15/2017 - Review Complete 11/15/2017  Allergen Reaction Noted  . Other Anaphylaxis 08/30/2012  . Eggs or egg-derived products  09/19/2012  . Peanut butter flavor  09/19/2012  . Peanut-containing drug products  09/19/2012    Family History  Problem Relation Age of Onset  . Seizures Mother   . Bipolar disorder Mother   . Hyperlipidemia Father   . Bipolar disorder Brother   . Anxiety disorder Brother     Social History   Socioeconomic History  . Marital status: Single    Spouse name: Not on file  . Number of children: 0  . Years of education: Not on file  . Highest education level: Not on file  Occupational History  . Occupation: Actuary    Comment: in [place]  . Financial resource strain: Not on file  . Food insecurity:    Worry: Not on  file    Inability: Not on file  . Transportation needs:    Medical: Not on file    Non-medical: Not on file  Tobacco Use  . Smoking status: Never Smoker  . Smokeless tobacco: Never Used  Substance and Sexual Activity  . Alcohol use: No  . Drug use: No  . Sexual activity: Not Currently  Lifestyle  . Physical activity:    Days per week: Not on file    Minutes per session: Not on file  . Stress: Not on file  Relationships  . Social connections:    Talks on phone: Not on file    Gets together: Not on file    Attends religious service: Not on file     Active member of club or organization: Not on file    Attends meetings of clubs or organizations: Not on file    Relationship status: Not on file  . Intimate partner violence:    Fear of current or ex partner: Not on file    Emotionally abused: Not on file    Physically abused: Not on file    Forced sexual activity: Not on file  Other Topics Concern  . Not on file  Social History Narrative  . Not on file     Physical Exam: BP 120/62   Pulse 74   Ht 5' 5"  (1.651 m)   Wt 126 lb 6 oz (57.3 kg)   BMI 21.03 kg/m  Constitutional: generally well-appearing Psychiatric: alert and oriented x3 Abdomen: soft, nontender, nondistended, no obvious ascites, no peritoneal signs, normal bowel sounds No peripheral edema noted in lower extremities  Assessment and plan: 22 y.o. male with extensive, moderate to severe ulcerative colitis  He is responsive to steroids but he was unable to taper while on full strength oral mesalamine.  I had a nice discussion with him and his mother in the office today about immunomodulators and biologic medicines for ulcerative colitis.  They understand that all suppress the immune system but in varying degrees and varying ways.  They understand that all of these increases risk for serious infections.  We discussed vaccinating him for pneumococcus and HPV and will work towards new start of entyvio  pending insurance coverage.  New start dosing regimen is as follows 300 mg IV at start, then  300 mg IV  at 2 weeks, then 300 mg IV at 6 weeks, then 300 mg IV every 8 weeks.  We are hoping to have this done an outpatient rheumatology office.  He knows not to start tapering his prednisone until after his first loading dose and then go very slowly with the taper.  He will return to see me in 6 weeks and sooner if needed.  Please see the "Patient Instructions" section for addition details about the plan.  [name], MD [facility] 11/15/2017, 9:52 AM

## 2017-11-17 LAB — GASTROINTESTINAL PATHOGEN PANEL PCR
C. DIFFICILE TOX A/B, PCR: NOT DETECTED
CAMPYLOBACTER, PCR: NOT DETECTED
Cryptosporidium, PCR: NOT DETECTED
E coli (ETEC) LT/ST PCR: NOT DETECTED
E coli (STEC) stx1/stx2, PCR: NOT DETECTED
E coli 0157, PCR: NOT DETECTED
GIARDIA LAMBLIA, PCR: NOT DETECTED
Norovirus, PCR: NOT DETECTED
ROTAVIRUS, PCR: NOT DETECTED
Salmonella, PCR: NOT DETECTED
Shigella, PCR: NOT DETECTED

## 2017-11-17 LAB — CLOSTRIDIUM DIFFICILE TOXIN B, QUALITATIVE, REAL-TIME PCR: CDIFFPCR: NOT DETECTED

## 2017-11-19 LAB — CLOSTRIDIUM DIFFICILE EIA: C DIFFICILE TOXINS A+ B, EIA: NEGATIVE

## 2017-11-21 ENCOUNTER — Telehealth: Payer: Self-pay

## 2017-11-21 NOTE — Telephone Encounter (Signed)
-----   Message from Angie Fava, LPN sent at 06/24/4830  1:22 PM EDT ----- Regarding: Christian Cardenas can you please get patient set up for [facility] new start.  He will need to start with 31m iv day zero,then 3069miv week #2,[id number]#6, then 300m14m8weeks. The patient would like to see Dr ShaiBo Merino is a rheumatologist. He will call his insurance and if unable to afford the medication he will call to see if something else is available.  Thanks, [employer]

## 2017-11-21 NOTE — Telephone Encounter (Signed)
Pt returning phone call best call back # [phone].

## 2017-11-22 NOTE — Telephone Encounter (Signed)
Left message on machine to call back  

## 2017-11-22 NOTE — Telephone Encounter (Signed)
New start dosing regimen is as follows 300 mg IV at start, then  300 mg IV  at 2 weeks, then 300 mg IV at 6 weeks, then 300 mg IV every 8 weeks.  We are hoping to have this done an outpatient rheumatology office. (Dr Estanislado Pandy) Referral has been made to Dr Estanislado Pandy and Weyman Rodney has been sent to [facility]

## 2017-11-23 ENCOUNTER — Other Ambulatory Visit: Payer: Self-pay | Admitting: Gastroenterology

## 2017-11-28 NOTE — Telephone Encounter (Signed)
I spoke with Dr Arlean Hopping office and the records are in review and may be a week or two before an appt is made.  I have notified the pt and he will call if he has not received a response by the end of next week.

## 2017-11-28 NOTE — Telephone Encounter (Signed)
Pt is wanting to follow up on IV medication best call back [phone].

## 2017-12-02 ENCOUNTER — Other Ambulatory Visit: Payer: Self-pay | Admitting: Gastroenterology

## 2017-12-16 ENCOUNTER — Telehealth: Payer: Self-pay | Admitting: Gastroenterology

## 2017-12-16 NOTE — Telephone Encounter (Signed)
Information was given to [facility] Dr Nelson Chimes last week.  Returned call to the pt Left message on machine to call back

## 2017-12-16 NOTE — Telephone Encounter (Signed)
Pt said that his insurance will cover entyvio. His pharmacy needs to know where his entyvio can be shipped. Pls call him.

## 2017-12-16 NOTE — Telephone Encounter (Signed)
The pt returned the call and will call [facility] and confirm appt and delivery info.  I have called on numerous occasions and have been told that the records are being reviewed by the MD.

## 2017-12-20 ENCOUNTER — Telehealth: Payer: Self-pay | Admitting: Gastroenterology

## 2017-12-20 ENCOUNTER — Other Ambulatory Visit: Payer: Self-pay

## 2017-12-20 DIAGNOSIS — K5100 Ulcerative (chronic) pancolitis without complications: Secondary | ICD-10-CM

## 2017-12-20 NOTE — Telephone Encounter (Signed)
Patient called and is frustrated with the lack of progress and her Entyvio infusions.  She has not heard from [facility] and can't speak with anyone at that office. I explained I was unaware that they had an infusion center.  She would like to get things going as quickly as possible.  She has agreed to a change in infusion sites to [facility].  She understands that she will get a call directly from them to schedule the infusion and that I will send a message to our pre-cert coordinator to work with her insurance for approval.

## 2017-12-20 NOTE — Telephone Encounter (Signed)
Pt would like to speak with you regarding taking another direction in regards to medication. Pls call her.

## 2017-12-28 ENCOUNTER — Other Ambulatory Visit: Payer: Self-pay | Admitting: Gastroenterology

## 2017-12-29 DIAGNOSIS — K5190 Ulcerative colitis, unspecified, without complications: Secondary | ICD-10-CM | POA: Diagnosis not present

## 2018-01-02 DIAGNOSIS — K5190 Ulcerative colitis, unspecified, without complications: Secondary | ICD-10-CM | POA: Diagnosis not present

## 2018-01-02 DIAGNOSIS — Z79899 Other long term (current) drug therapy: Secondary | ICD-10-CM | POA: Diagnosis not present

## 2018-01-03 ENCOUNTER — Ambulatory Visit (INDEPENDENT_AMBULATORY_CARE_PROVIDER_SITE_OTHER): Payer: BLUE CROSS/BLUE SHIELD | Admitting: Gastroenterology

## 2018-01-03 DIAGNOSIS — Z23 Encounter for immunization: Secondary | ICD-10-CM | POA: Diagnosis not present

## 2018-01-03 DIAGNOSIS — K5100 Ulcerative (chronic) pancolitis without complications: Secondary | ICD-10-CM

## 2018-01-06 ENCOUNTER — Telehealth: Payer: Self-pay | Admitting: Gastroenterology

## 2018-01-06 NOTE — Telephone Encounter (Signed)
The pt will be getting infusions at [facility] is aware

## 2018-01-10 ENCOUNTER — Encounter (HOSPITAL_COMMUNITY): Payer: Self-pay | Admitting: *Deleted

## 2018-01-10 ENCOUNTER — Other Ambulatory Visit: Payer: Self-pay

## 2018-01-10 ENCOUNTER — Emergency Department (HOSPITAL_COMMUNITY)
Admission: EM | Admit: 2018-01-10 | Discharge: 2018-01-10 | Disposition: A | Discharge status: Home / Self Care | Payer: BLUE CROSS/BLUE SHIELD | Attending: Emergency Medicine | Admitting: Emergency Medicine

## 2018-01-10 DIAGNOSIS — R269 Unspecified abnormalities of gait and mobility: Secondary | ICD-10-CM | POA: Diagnosis not present

## 2018-01-10 DIAGNOSIS — R110 Nausea: Secondary | ICD-10-CM | POA: Diagnosis not present

## 2018-01-10 DIAGNOSIS — I951 Orthostatic hypotension: Secondary | ICD-10-CM | POA: Diagnosis not present

## 2018-01-10 DIAGNOSIS — R251 Tremor, unspecified: Secondary | ICD-10-CM | POA: Diagnosis not present

## 2018-01-10 DIAGNOSIS — Z79899 Other long term (current) drug therapy: Secondary | ICD-10-CM | POA: Diagnosis not present

## 2018-01-10 DIAGNOSIS — R479 Unspecified speech disturbances: Secondary | ICD-10-CM | POA: Diagnosis not present

## 2018-01-10 DIAGNOSIS — Z91010 Allergy to peanuts: Secondary | ICD-10-CM | POA: Insufficient documentation

## 2018-01-10 DIAGNOSIS — R531 Weakness: Secondary | ICD-10-CM | POA: Diagnosis not present

## 2018-01-10 DIAGNOSIS — R42 Dizziness and giddiness: Secondary | ICD-10-CM | POA: Diagnosis not present

## 2018-01-10 DIAGNOSIS — R802 Orthostatic proteinuria, unspecified: Secondary | ICD-10-CM | POA: Diagnosis not present

## 2018-01-10 LAB — URINALYSIS, ROUTINE W REFLEX MICROSCOPIC
Bilirubin Urine: NEGATIVE
GLUCOSE, UA: NEGATIVE mg/dL
HGB URINE DIPSTICK: NEGATIVE
Ketones, ur: NEGATIVE mg/dL
Leukocytes, UA: NEGATIVE
Nitrite: NEGATIVE
PH: 7 (ref 5.0–8.0)
Protein, ur: NEGATIVE mg/dL
SPECIFIC GRAVITY, URINE: 1.011 (ref 1.005–1.030)

## 2018-01-10 LAB — RAPID URINE DRUG SCREEN, HOSP PERFORMED
AMPHETAMINES: NOT DETECTED
BARBITURATES: NOT DETECTED
Benzodiazepines: NOT DETECTED
Cocaine: NOT DETECTED
Opiates: NOT DETECTED
TETRAHYDROCANNABINOL: NOT DETECTED

## 2018-01-10 LAB — COMPREHENSIVE METABOLIC PANEL
ALBUMIN: 3.8 g/dL (ref 3.5–5.0)
ALT: 14 U/L (ref 0–44)
AST: 14 U/L — AB (ref 15–41)
Alkaline Phosphatase: 43 U/L (ref 38–126)
Anion gap: 7 (ref 5–15)
BILIRUBIN TOTAL: 0.8 mg/dL (ref 0.3–1.2)
BUN: 14 mg/dL (ref 6–20)
CHLORIDE: 101 mmol/L (ref 98–111)
CO2: 30 mmol/L (ref 22–32)
Calcium: 8.9 mg/dL (ref 8.9–10.3)
Creatinine, Ser: 0.78 mg/dL (ref 0.61–1.24)
GFR calc Af Amer: >60 mL/min (ref >60)
GFR calc non Af Amer: >60 mL/min (ref >60)
GLUCOSE: 89 mg/dL (ref 70–99)
Potassium: 3.9 mmol/L (ref 3.5–5.1)
Sodium: 138 mmol/L (ref 135–145)
TOTAL PROTEIN: 7 g/dL (ref 6.5–8.1)

## 2018-01-10 LAB — CBC WITH DIFFERENTIAL/PLATELET
BASOS PCT: 0 %
Basophils Absolute: 0 10*3/uL (ref 0.0–0.1)
EOS ABS: 0.4 10*3/uL (ref 0.0–0.7)
Eosinophils Relative: 5 %
HCT: 40.4 % (ref 39.0–52.0)
HEMOGLOBIN: 11.9 g/dL — AB (ref 13.0–17.0)
Lymphocytes Relative: 28 %
Lymphs Abs: 2.2 10*3/uL (ref 0.7–4.0)
MCH: 24 pg — ABNORMAL LOW (ref 26.0–34.0)
MCHC: 29.5 g/dL — AB (ref 30.0–36.0)
MCV: 81.6 fL (ref 78.0–100.0)
MONOS PCT: 10 %
Monocytes Absolute: 0.8 10*3/uL (ref 0.1–1.0)
NEUTROS PCT: 57 %
Neutro Abs: 4.5 10*3/uL (ref 1.7–7.7)
Platelets: 220 10*3/uL (ref 150–400)
RBC: 4.95 MIL/uL (ref 4.22–5.81)
RDW: 14.8 % (ref 11.5–15.5)
WBC: 8 10*3/uL (ref 4.0–10.5)

## 2018-01-10 MED ORDER — MECLIZINE HCL 25 MG PO TABS: 25.0000 mg | ORAL_TABLET | Freq: Three times a day (TID) | ORAL | 0 refills | Status: DC | PRN

## 2018-01-10 MED ORDER — MECLIZINE HCL 12.5 MG PO TABS
25.0000 mg | ORAL_TABLET | Freq: Once | ORAL | Status: AC
  Administered 2018-01-10: 25 mg via ORAL
  Filled 2018-01-10: qty 2

## 2018-01-10 MED ORDER — SODIUM CHLORIDE 0.9 % IV BOLUS
1000.0000 mL | Freq: Once | INTRAVENOUS | Status: AC
  Administered 2018-01-10: 1000 mL via INTRAVENOUS

## 2018-01-10 NOTE — ED Triage Notes (Signed)
Pt c/o bilateral leg weakness, tremors, trouble talking, difficulty standing, "walking to the left" when ambulating that started this morning when he woke up at 0900. Pt went to bed at 0300 this morning and felt normal. Pt's speech sounds as if he is stuttering. Grips equal bilaterally, sensation normal bilaterally in triage.

## 2018-01-10 NOTE — ED Provider Notes (Signed)
[facility] EMERGENCY DEPARTMENT Provider Note   CSN: [id number] Arrival date & time: 01/10/18  1426     History   Chief Complaint Chief Complaint  Patient presents with  . Weakness    HPI Christian Cardenas is a 22 y.o. male.  HPI Patient with history of ulcerative colitis presents with dizziness since waking this morning at 9 AM.  Describes the dizziness as room spinning sensation and feeling off balance.  States he feels like he may be falling to the left.  Associated with nausea.  Worse with position change.  Complains of feeling tremulous and occasional stuttering speech.  Denies previously similar symptoms.  Denies stimulant use or heavy alcohol consumption.  Denies focal weakness or numbness.  Started new IV medication, Entyvio, for ulcerative colitis 2 weeks ago. Past Medical History:  Diagnosis Date  . Eczema   . Ulcerative colitis [facility])     Patient Active Problem List   Diagnosis Date Noted  . Ulcerative pancolitis without complication ([place]) 48/06/6551  . Rectal bleeding 07/27/2017  . Abdominal pain 07/27/2017  . Diarrhea 07/27/2017  . Eczema 06/17/2017    Past Surgical History:  Procedure Laterality Date  . COLONOSCOPY W/ BIOPSIES    . MOUTH SURGERY          Home Medications    Prior to Admission medications   Medication Sig Start Date End Date Taking? Authorizing Provider  predniSONE (DELTASONE) 10 MG tablet Take 10 mg by mouth daily. 12/29/17  Yes [provider]  triamcinolone cream (KENALOG) 0.1 % Apply 1 application topically daily as needed (for irritation).  06/08/17  Yes [provider]  vedolizumab (ENTYVIO) 300 MG injection Inject 300 mg into the vein as directed.   Yes [provider]  betamethasone dipropionate (DIPROLENE) 0.05 % cream Apply topically 2 (two) times daily. Patient taking differently: Apply 1 application topically daily as needed (for irritation).  09/19/12   Hassell Done Mary-Margaret, FNP  diphenhydrAMINE  (BENADRYL) 50 MG tablet Take 50 mg by mouth at bedtime as needed for itching.    [provider]  fluticasone (FLONASE) 50 MCG/ACT nasal spray Place 1 spray into both nostrils 2 (two) times daily as needed for allergies or rhinitis. 08/30/17   Dettinger, Fransisca Kaufmann, MD  meclizine (ANTIVERT) 25 MG tablet Take 1 tablet (25 mg total) by mouth 3 (three) times daily as needed for dizziness. 01/10/18   Julianne Rice, MD    Family History Family History  Problem Relation Age of Onset  . Seizures Mother   . Bipolar disorder Mother   . Hyperlipidemia Father   . Bipolar disorder Brother   . Anxiety disorder Brother     Social History Social History   Tobacco Use  . Smoking status: Never Smoker  . Smokeless tobacco: Never Used  Substance Use Topics  . Alcohol use: No  . Drug use: No     Allergies   Peanut-containing drug products; Eggs or egg-derived products; and Peanut butter flavor   Review of Systems Review of Systems  Constitutional: Negative for chills and fever.  HENT: Negative for congestion, sinus pressure, sinus pain, sore throat and tinnitus.   Eyes: Negative for visual disturbance.  Respiratory: Negative for cough and shortness of breath.   Cardiovascular: Negative for chest pain and palpitations.  Gastrointestinal: Positive for nausea. Negative for constipation, diarrhea and vomiting.  Genitourinary: Negative for dysuria, frequency and hematuria.  Musculoskeletal: Positive for gait problem. Negative for back pain, myalgias and neck pain.  Skin: Negative  for rash and wound.  Neurological: Positive for dizziness and tremors. Negative for weakness, light-headedness, numbness and headaches.  All other systems reviewed and are negative.    Physical Exam Updated Vital Signs BP 107/74 (BP Location: Left Arm)   Pulse 93   Temp 98 F (36.7 C) (Oral)   Resp 14   Ht 5' 5"  (1.651 m)   Wt 57.6 kg   SpO2 100%   BMI 21.13 kg/m   Physical Exam  Constitutional: He  is oriented to person, place, and time. He appears well-developed and well-nourished. No distress.  HENT:  Head: Normocephalic and atraumatic.  Mouth/Throat: Oropharynx is clear and moist. No oropharyngeal exudate.  Eyes: Pupils are equal, round, and reactive to light. EOM are normal.  Fatigable horizontal nystagmus with the fast beat to the left  Neck: Normal range of motion. Neck supple. No JVD present.  No meningismus  Cardiovascular: Normal rate and regular rhythm. Exam reveals no gallop and no friction rub.  No murmur heard. Pulmonary/Chest: Effort normal and breath sounds normal. No stridor. No respiratory distress. He has no wheezes. He has no rales. He exhibits no tenderness.  Abdominal: Soft. Bowel sounds are normal. There is tenderness. There is no rebound and no guarding.  Mild diffuse abdominal tenderness without focality, rebound or guarding.  Musculoskeletal: Normal range of motion. He exhibits no edema or tenderness.  No midline thoracic or lumbar tenderness.  No CVA tenderness.  No lower extremity swelling, asymmetry or tenderness.  Lymphadenopathy:    He has no cervical adenopathy.  Neurological: He is alert and oriented to person, place, and time.  Patient is alert and oriented x3 with clear, goal oriented speech. Patient has 5/5 motor in all extremities. Sensation is intact to light touch. Bilateral finger-to-nose is normal with no signs of dysmetria.  Patient has very mild bilateral upper extremity tremor  Skin: Skin is warm and dry. Capillary refill takes less than 2 seconds. No rash noted. He is not diaphoretic. No erythema.  Psychiatric: He has a normal mood and affect. His behavior is normal.  Nursing note and vitals reviewed.    ED Treatments / Results  Labs (all labs ordered are listed, but only abnormal results are displayed) Labs Reviewed  CBC WITH DIFFERENTIAL/PLATELET - Abnormal; Notable for the following components:      Result Value   Hemoglobin 11.9 (*)     MCH 24.0 (*)    MCHC 29.5 (*)    All other components within normal limits  COMPREHENSIVE METABOLIC PANEL - Abnormal; Notable for the following components:   AST 14 (*)    All other components within normal limits  URINALYSIS, ROUTINE W REFLEX MICROSCOPIC - Abnormal; Notable for the following components:   Color, Urine STRAW (*)    All other components within normal limits  RAPID URINE DRUG SCREEN, HOSP PERFORMED    EKG None  Radiology No results found.  Procedures Procedures (including critical care time)  Medications Ordered in ED Medications  sodium chloride 0.9 % bolus 1,000 mL (0 mLs Intravenous Stopped 01/10/18 1733)  meclizine (ANTIVERT) tablet 25 mg (25 mg Oral Given 01/10/18 1538)  sodium chloride 0.9 % bolus 1,000 mL (0 mLs Intravenous Stopped 01/10/18 1920)     Initial Impression / Assessment and Plan / ED Course  I have reviewed the triage vital signs and the nursing notes.  Pertinent labs & imaging results that were available during my care of the patient were reviewed by me and considered in my  medical decision making (see chart for details).     Patient is feeling better after IV fluids and meclizine.  Hemoglobin is stable.  Patient does have elevation in heart rate with position change.  Likely symptoms due to peripheral vertigo and/or orthostasis.  Normal neurologic exam.  Advised to change positions slowly.  Make sure you are drinking plenty of fluids.  Follow-up with his primary provider.  Return precautions have been given.  Final Clinical Impressions(s) / ED Diagnoses   Final diagnoses:  Dizziness  Orthostasis    ED Discharge Orders         Ordered    meclizine (ANTIVERT) 25 MG tablet  3 times daily PRN     01/10/18 1937           Julianne Rice, MD 01/10/18 1939

## 2018-01-12 ENCOUNTER — Telehealth: Payer: Self-pay | Admitting: Gastroenterology

## 2018-01-12 NOTE — Telephone Encounter (Signed)
Left message on machine to call back  

## 2018-01-13 NOTE — Telephone Encounter (Signed)
Dr Ardis Hughs the pt states he had some dizziness, fatigue about a week after his Entyvio.  He went to the ED and was told he was dehydrated.  His next Infusion is 8/20.  Do you think this is side effects from [facility] and can he have the next infusion?

## 2018-01-16 NOTE — Telephone Encounter (Signed)
Entyvio MAY have played a role here. Should still plan on on time next infusion to see if the symptom complex repeats, hydrate a lot for 24hours prior.

## 2018-01-16 NOTE — Telephone Encounter (Signed)
The patient has been notified of this information and all questions answered. The pt has been advised of the information and verbalized understanding.    

## 2018-01-17 DIAGNOSIS — K5190 Ulcerative colitis, unspecified, without complications: Secondary | ICD-10-CM | POA: Diagnosis not present

## 2018-02-14 DIAGNOSIS — K5190 Ulcerative colitis, unspecified, without complications: Secondary | ICD-10-CM | POA: Diagnosis not present

## 2018-02-15 DIAGNOSIS — L209 Atopic dermatitis, unspecified: Secondary | ICD-10-CM | POA: Diagnosis not present

## 2018-02-15 DIAGNOSIS — L219 Seborrheic dermatitis, unspecified: Secondary | ICD-10-CM | POA: Diagnosis not present

## 2018-02-16 ENCOUNTER — Telehealth: Payer: Self-pay | Admitting: Gastroenterology

## 2018-02-16 NOTE — Telephone Encounter (Signed)
Patient wanting to speak with the nurse for an update on his symptoms and review medications.

## 2018-02-16 NOTE — Telephone Encounter (Signed)
Left message on machine to call back  

## 2018-02-17 ENCOUNTER — Encounter: Payer: Self-pay | Admitting: Gastroenterology

## 2018-02-17 ENCOUNTER — Ambulatory Visit: Payer: BLUE CROSS/BLUE SHIELD | Admitting: Gastroenterology

## 2018-02-17 ENCOUNTER — Other Ambulatory Visit (INDEPENDENT_AMBULATORY_CARE_PROVIDER_SITE_OTHER): Payer: BLUE CROSS/BLUE SHIELD

## 2018-02-17 VITALS — BP 106/62 | HR 64 | Ht 65.0 in | Wt 128.0 lb

## 2018-02-17 DIAGNOSIS — R197 Diarrhea, unspecified: Secondary | ICD-10-CM

## 2018-02-17 DIAGNOSIS — K5100 Ulcerative (chronic) pancolitis without complications: Secondary | ICD-10-CM

## 2018-02-17 DIAGNOSIS — K51918 Ulcerative colitis, unspecified with other complication: Secondary | ICD-10-CM | POA: Diagnosis not present

## 2018-02-17 LAB — CBC WITH DIFFERENTIAL/PLATELET
BASOS ABS: 0 10*3/uL (ref 0.0–0.1)
Basophils Relative: 0.8 % (ref 0.0–3.0)
EOS ABS: 0.5 10*3/uL (ref 0.0–0.7)
Eosinophils Relative: 8.6 % — ABNORMAL HIGH (ref 0.0–5.0)
HCT: 39.5 % (ref 39.0–52.0)
Hemoglobin: 12.8 g/dL — ABNORMAL LOW (ref 13.0–17.0)
LYMPHS PCT: 21.8 % (ref 12.0–46.0)
Lymphs Abs: 1.4 10*3/uL (ref 0.7–4.0)
MCHC: 32.3 g/dL (ref 30.0–36.0)
MCV: 77 fl — ABNORMAL LOW (ref 78.0–100.0)
Monocytes Absolute: 0.7 10*3/uL (ref 0.1–1.0)
Monocytes Relative: 10.4 % (ref 3.0–12.0)
NEUTROS ABS: 3.7 10*3/uL (ref 1.4–7.7)
Neutrophils Relative %: 58.4 % (ref 43.0–77.0)
PLATELETS: 262 10*3/uL (ref 150.0–400.0)
RBC: 5.13 Mil/uL (ref 4.22–5.81)
RDW: 16 % — ABNORMAL HIGH (ref 11.5–15.5)
WBC: 6.3 10*3/uL (ref 4.0–10.5)

## 2018-02-17 LAB — COMPREHENSIVE METABOLIC PANEL
ALT: 10 U/L (ref 0–53)
AST: 13 U/L (ref 0–37)
Albumin: 4.2 g/dL (ref 3.5–5.2)
Alkaline Phosphatase: 69 U/L (ref 39–117)
BILIRUBIN TOTAL: 0.4 mg/dL (ref 0.2–1.2)
BUN: 12 mg/dL (ref 6–23)
CALCIUM: 9.3 mg/dL (ref 8.4–10.5)
CHLORIDE: 103 meq/L (ref 96–112)
CO2: 33 meq/L — AB (ref 19–32)
Creatinine, Ser: 0.88 mg/dL (ref 0.40–1.50)
GFR: 112.49 mL/min (ref >60.00)
Glucose, Bld: 86 mg/dL (ref 70–99)
Potassium: 4.5 mEq/L (ref 3.5–5.1)
Sodium: 139 mEq/L (ref 135–145)
Total Protein: 7.5 g/dL (ref 6.0–8.3)

## 2018-02-17 LAB — SEDIMENTATION RATE: Sed Rate: 52 mm/hr — ABNORMAL HIGH (ref 0–15)

## 2018-02-17 NOTE — Progress Notes (Signed)
Review of pertinent gastrointestinal problems: 1. Extensive, severe UC: Presented with abdominal pain, bloody diarrhea February 2019.  CT scan showed pan colonic inflammation.  Colonoscopy, Dr. Ardis Hughs April 2019 showed moderate to severe inflammation throughout his colon.  Biopsies from this confirmed inflammatory bowel disease without granulomas.  There was no rectal sparing.  The terminal ileum was normal.  He responded to prednisone and was started on oral full strength mesalamine.  10/2017 he was clearly steroid dependent (could not wean off steroids without colitis symptoms recurring) and so decided to start entyvio  10/2017 labs: Hepatitis B surface antigen and antibody negative, Hepatitis C Ab negative, HIV negative, TB quant gold negative.   11/2017 Entyvio new start.   HPI: This is a very pleasant 22 year old man whom I last saw here in the office about 3 months ago.  After that visit he started [facility] and he just got his 6-week infusion.  His next infusion would be about 8 weeks from now.  He noticed good improvement after the first couple infusions but also did notice that as time got closer to his next infusion he started having some bleeding, urgency and diarrhea and abdominal cramping again.  This unfortunately has persisted even since his third, 6-week infusion 3 days ago.  He has cramping, bloody diarrhea, loose stools.  Chief complaint is extensive severe ulcerative colitis  ROS: complete GI ROS as described in HPI, all other review negative.  Constitutional:  No unintentional weight loss   Past Medical History:  Diagnosis Date  . Eczema   . Ulcerative colitis [name])     Past Surgical History:  Procedure Laterality Date  . COLONOSCOPY W/ BIOPSIES    . MOUTH SURGERY      Current Outpatient Medications  Medication Sig Dispense Refill  . betamethasone dipropionate (DIPROLENE) 0.05 % cream Apply topically 2 (two) times daily. (Patient taking differently: Apply 1  application topically daily as needed (for irritation). ) 60 g 1  . diphenhydrAMINE (BENADRYL) 50 MG tablet Take 50 mg by mouth at bedtime as needed for itching.    . fluticasone (FLONASE) 50 MCG/ACT nasal spray Place 1 spray into both nostrils 2 (two) times daily as needed for allergies or rhinitis. 16 g 6  . meclizine (ANTIVERT) 25 MG tablet Take 1 tablet (25 mg total) by mouth 3 (three) times daily as needed for dizziness. 30 tablet 0  . triamcinolone cream (KENALOG) 0.1 % Apply 1 application topically daily as needed (for irritation).   1  . vedolizumab (ENTYVIO) 300 MG injection Inject 300 mg into the vein as directed.     No current facility-administered medications for this visit.     Allergies as of 02/17/2018 - Review Complete 02/17/2018  Allergen Reaction Noted  . Peanut-containing drug products Anaphylaxis 09/19/2012  . Eggs or egg-derived products  09/19/2012  . Peanut butter flavor  09/19/2012    Family History  Problem Relation Age of Onset  . Seizures Mother   . Bipolar disorder Mother   . Hyperlipidemia Father   . Bipolar disorder Brother   . Anxiety disorder Brother     Social History   Socioeconomic History  . Marital status: Single    Spouse name: Not on file  . Number of children: 0  . Years of education: Not on file  . Highest education level: Not on file  Occupational History  . Occupation: Actuary    Comment: in [place]  . Financial resource strain: Not on file  .  Food insecurity:    Worry: Not on file    Inability: Not on file  . Transportation needs:    Medical: Not on file    Non-medical: Not on file  Tobacco Use  . Smoking status: Never Smoker  . Smokeless tobacco: Never Used  Substance and Sexual Activity  . Alcohol use: No  . Drug use: No  . Sexual activity: Not Currently  Lifestyle  . Physical activity:    Days per week: Not on file    Minutes per session: Not on file  . Stress: Not on file  Relationships  .  Social connections:    Talks on phone: Not on file    Gets together: Not on file    Attends religious service: Not on file    Active member of club or organization: Not on file    Attends meetings of clubs or organizations: Not on file    Relationship status: Not on file  . Intimate partner violence:    Fear of current or ex partner: Not on file    Emotionally abused: Not on file    Physically abused: Not on file    Forced sexual activity: Not on file  Other Topics Concern  . Not on file  Social History Narrative  . Not on file     Physical Exam: BP 106/62   Pulse 64   Ht 5' 5"  (1.651 m)   Wt 128 lb (58.1 kg)   BMI 21.30 kg/m  Constitutional: generally well-appearing Psychiatric: alert and oriented x3 Abdomen: soft, mildly tender throughout, nondistended, no obvious ascites, no peritoneal signs, normal bowel sounds No peripheral edema noted in lower extremities  Assessment and plan: 22 y.o. male with extensive severe ulcerative colitis  He may not be responding well to [facility].  He will get a set of labs including CBC, complete metabolic profile, sedimentation rate as well as GI pathogen panel to check for infectious issues.  If he proves to not have any infection and his symptoms are getting worse or not improving then I will consider him an Entyvio failure and I would start him on a different biologic after oral steroids.  Likely Remicade.  We will get an office visit in the books for him for 6 or 7 weeks from now but we will clearly be communicating with him sooner than that with his labs, stool test results and to check in to see how he is doing clinically.  Please see the "Patient Instructions" section for addition details about the plan.  [name], MD [facility] 02/17/2018, 3:16 PM

## 2018-02-17 NOTE — Patient Instructions (Addendum)
You will have labs checked today in the basement lab.  Please head down after you check out with the front desk  (cbc, cmet, esr, GI pathogen panel).  If you do not have infection and your bowels are not improving in next several days, we will likely have to consider other options to treat your UC.  Your provider has requested that you go to the basement level for lab work before leaving today. Press "B" on the elevator. The lab is located at the first door on the left as you exit the elevator.   Please return to see Dr. Ardis Hughs in 6-7 weeks. Please call the office in a few weeks to set up a follow up appointment [phone]  Thank you for entrusting me with your care and choosing [facility].  Dr Ardis Hughs

## 2018-02-17 NOTE — Telephone Encounter (Signed)
The pt states that Christian Cardenas was working great but recently has noticed the symptoms return prior to next infusion and has questions about what else he can do.  Cant wait for Dr Ardis Hughs next available  Scheduled to see Janett Billow on 9/27

## 2018-02-21 ENCOUNTER — Telehealth: Payer: Self-pay | Admitting: Gastroenterology

## 2018-02-21 LAB — GASTROINTESTINAL PATHOGEN PANEL PCR
C. difficile Tox A/B, PCR: NOT DETECTED
CAMPYLOBACTER, PCR: NOT DETECTED
CRYPTOSPORIDIUM, PCR: NOT DETECTED
E coli (ETEC) LT/ST PCR: NOT DETECTED
E coli (STEC) stx1/stx2, PCR: NOT DETECTED
E coli 0157, PCR: NOT DETECTED
GIARDIA LAMBLIA, PCR: NOT DETECTED
NOROVIRUS, PCR: NOT DETECTED
Rotavirus A, PCR: NOT DETECTED
SHIGELLA, PCR: NOT DETECTED
Salmonella, PCR: NOT DETECTED

## 2018-02-21 NOTE — Telephone Encounter (Signed)
The pt was given his lab results and advised as soon as we receive the stool test results Dr Ardis Hughs will make further recommendations

## 2018-02-24 ENCOUNTER — Ambulatory Visit: Payer: BLUE CROSS/BLUE SHIELD | Admitting: Gastroenterology

## 2018-02-24 ENCOUNTER — Encounter: Payer: Self-pay | Admitting: Gastroenterology

## 2018-02-24 ENCOUNTER — Other Ambulatory Visit: Payer: Self-pay

## 2018-02-24 DIAGNOSIS — K5100 Ulcerative (chronic) pancolitis without complications: Secondary | ICD-10-CM

## 2018-02-28 ENCOUNTER — Telehealth: Payer: Self-pay | Admitting: Gastroenterology

## 2018-02-28 NOTE — Telephone Encounter (Signed)
The pt will call [facility] to set up appt for Remicade.

## 2018-03-14 DIAGNOSIS — K5190 Ulcerative colitis, unspecified, without complications: Secondary | ICD-10-CM | POA: Diagnosis not present

## 2018-03-28 DIAGNOSIS — K5190 Ulcerative colitis, unspecified, without complications: Secondary | ICD-10-CM | POA: Diagnosis not present

## 2018-04-04 ENCOUNTER — Telehealth: Payer: Self-pay | Admitting: Gastroenterology

## 2018-04-04 NOTE — Telephone Encounter (Signed)
Pt wants to know if Dr. Ardis Hughs wants to see him in the office. He stated that it depended on his progress.

## 2018-04-04 NOTE — Telephone Encounter (Signed)
11/25 appt with Dr Ardis Hughs.  Will keep that appt.

## 2018-04-24 ENCOUNTER — Encounter: Payer: Self-pay | Admitting: Gastroenterology

## 2018-04-24 ENCOUNTER — Ambulatory Visit (INDEPENDENT_AMBULATORY_CARE_PROVIDER_SITE_OTHER): Payer: BLUE CROSS/BLUE SHIELD | Admitting: Gastroenterology

## 2018-04-24 ENCOUNTER — Other Ambulatory Visit (INDEPENDENT_AMBULATORY_CARE_PROVIDER_SITE_OTHER): Payer: BLUE CROSS/BLUE SHIELD

## 2018-04-24 VITALS — BP 110/60 | HR 114 | Ht 65.0 in | Wt 115.0 lb

## 2018-04-24 DIAGNOSIS — K5100 Ulcerative (chronic) pancolitis without complications: Secondary | ICD-10-CM

## 2018-04-24 LAB — CBC WITH DIFFERENTIAL/PLATELET
Basophils Absolute: 0 10*3/uL (ref 0.0–0.1)
Basophils Relative: 0.4 % (ref 0.0–3.0)
EOS ABS: 0.4 10*3/uL (ref 0.0–0.7)
Eosinophils Relative: 6.3 % — ABNORMAL HIGH (ref 0.0–5.0)
HEMATOCRIT: 34.6 % — AB (ref 39.0–52.0)
HEMOGLOBIN: 11.2 g/dL — AB (ref 13.0–17.0)
LYMPHS PCT: 25.4 % (ref 12.0–46.0)
Lymphs Abs: 1.6 10*3/uL (ref 0.7–4.0)
MCHC: 32.3 g/dL (ref 30.0–36.0)
MCV: 78 fl (ref 78.0–100.0)
Monocytes Absolute: 0.8 10*3/uL (ref 0.1–1.0)
Monocytes Relative: 13.2 % — ABNORMAL HIGH (ref 3.0–12.0)
Neutro Abs: 3.4 10*3/uL (ref 1.4–7.7)
Neutrophils Relative %: 54.7 % (ref 43.0–77.0)
PLATELETS: 298 10*3/uL (ref 150.0–400.0)
RBC: 4.44 Mil/uL (ref 4.22–5.81)
RDW: 14.2 % (ref 11.5–15.5)
WBC: 6.2 10*3/uL (ref 4.0–10.5)

## 2018-04-24 LAB — COMPREHENSIVE METABOLIC PANEL
ALBUMIN: 3.8 g/dL (ref 3.5–5.2)
ALT: 6 U/L (ref 0–53)
AST: 11 U/L (ref 0–37)
Alkaline Phosphatase: 59 U/L (ref 39–117)
BILIRUBIN TOTAL: 0.4 mg/dL (ref 0.2–1.2)
BUN: 8 mg/dL (ref 6–23)
CALCIUM: 9 mg/dL (ref 8.4–10.5)
CO2: 28 mEq/L (ref 19–32)
CREATININE: 0.88 mg/dL (ref 0.40–1.50)
Chloride: 105 mEq/L (ref 96–112)
GFR: 112.33 mL/min (ref >60.00)
Glucose, Bld: 88 mg/dL (ref 70–99)
Potassium: 4.1 mEq/L (ref 3.5–5.1)
Sodium: 139 mEq/L (ref 135–145)
Total Protein: 7.1 g/dL (ref 6.0–8.3)

## 2018-04-24 LAB — SEDIMENTATION RATE: Sed Rate: 47 mm/hr — ABNORMAL HIGH (ref 0–15)

## 2018-04-24 MED ORDER — PREDNISONE 20 MG PO TABS: ORAL_TABLET | ORAL | 0 refills | Status: DC

## 2018-04-24 NOTE — Patient Instructions (Addendum)
You will have labs checked today in the basement lab.  Please head down after you check out with the front desk  (cbc, cmet, esr).  Continue remicade infusions.  Try lactaid pills; start with 2 pills with any dairy  Prednisone 62m, take once daily for 3 weeks then decrease by 129mper week.  Please return to see Dr. JaHettie Holsteinn 4-6 weeks.  Take simethicone (gas ex) as needed for bloating, gas pains.  We have scheduled you for a follow up appointment with jeEllouise Newern 05/09/18 at 2:15pm  Your provider has requested that you go to the basement level for lab work before leaving today. Press "B" on the elevator. The lab is located at the first door on the left as you exit the elevator.  Thank you for entrusting me with your care and choosing [facility] JaArdis Hughs

## 2018-04-24 NOTE — Progress Notes (Signed)
Review of pertinent gastrointestinal problems: 1.Extensive, severe MC:[id number] with abdominal pain, bloody diarrhea February 2019. CT scan showed pan colonic inflammation. Colonoscopy, Dr. Ardis Hughs April 2019 showed moderate to severe inflammation throughout his colon. Biopsies from this confirmed inflammatory bowel disease without granulomas. There was no rectal sparing. The terminal ileum was normal. He responded to prednisone and was started on oral full strength mesalamine.  10/2017 he was clearly steroid dependent (could not wean off steroids without colitis symptoms recurring) and so decided to start entyvio  10/2017 labs: Hepatitis B surface antigen and antibody negative, Hepatitis C Ab negative, HIV negative, TB quant gold negative.  11/2017 Entyvio new start.  01/2018 seemingly non-responder to entyvio (sed rate 52, Gi path panel negative, clinically unchanged), so changed to remicade 24m/kg   HPI: This is a very pleasant 22yo man   He has had two doses of remicade, the third induction infusion is tomorrow.  He has noticed improvement: but still bothered by gas, abd discomfort.  Less overt bleeding.  Less frequency.    Still quite intolerant of dairy products  Has lost 13 pounds since visit 2 months ago.  Chief complaint is UC  ROS: complete GI ROS as described in HPI, all other review negative.  Constitutional:  No unintentional weight loss   Past Medical History:  Diagnosis Date  . Eczema   . Ulcerative colitis ([facility]     Past Surgical History:  Procedure Laterality Date  . COLONOSCOPY W/ BIOPSIES    . MOUTH SURGERY      Current Outpatient Medications  Medication Sig Dispense Refill  . betamethasone dipropionate (DIPROLENE) 0.05 % cream Apply topically 2 (two) times daily. (Patient taking differently: Apply 1 application topically daily as needed (for irritation). ) 60 g 1  . diphenhydrAMINE (BENADRYL) 50 MG tablet Take 50 mg by mouth at bedtime as needed for  itching.    . fluticasone (FLONASE) 50 MCG/ACT nasal spray Place 1 spray into both nostrils 2 (two) times daily as needed for allergies or rhinitis. 16 g 6  . meclizine (ANTIVERT) 25 MG tablet Take 1 tablet (25 mg total) by mouth 3 (three) times daily as needed for dizziness. 30 tablet 0  . triamcinolone cream (KENALOG) 0.1 % Apply 1 application topically daily as needed (for irritation).   1  . vedolizumab (ENTYVIO) 300 MG injection Inject 300 mg into the vein as directed.     No current facility-administered medications for this visit.     Allergies as of 04/24/2018 - Review Complete 04/24/2018  Allergen Reaction Noted  . Peanut-containing drug products Anaphylaxis 09/19/2012  . Eggs or egg-derived products  09/19/2012  . Peanut butter flavor  09/19/2012    Family History  Problem Relation Age of Onset  . Seizures Mother   . Bipolar disorder Mother   . Hyperlipidemia Father   . Bipolar disorder Brother   . Anxiety disorder Brother     Social History   Socioeconomic History  . Marital status: Single    Spouse name: Not on file  . Number of children: 0  . Years of education: Not on file  . Highest education level: Not on file  Occupational History  . 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 Financial resource strain: Not on file  . Food insecurity:    Worry: Not on file    Inability: Not on file  . Transportation needs:    Medical: Not on file  Non-medical: Not on file  Tobacco Use  . Smoking status: Never Smoker  . Smokeless tobacco: Never Used  Substance and Sexual Activity  . Alcohol use: No  . Drug use: No  . Sexual activity: Not Currently  Lifestyle  . Physical activity:    Days per week: Not on file    Minutes per session: Not on file  . Stress: Not on file  Relationships  . Social connections:    Talks on phone: Not on file    Gets together: Not on file    Attends religious service: Not on file    Active member of club or  organization: Not on file    Attends meetings of clubs or organizations: Not on file    Relationship status: Not on file  . Intimate partner violence:    Fear of current or ex partner: Not on file    Emotionally abused: Not on file    Physically abused: Not on file    Forced sexual activity: Not on file  Other Topics Concern  . Not on file  Social History Narrative  . Not on file     Physical Exam: BP 110/60   Pulse (!) 114   Ht _0  (1.651 m)   Wt 115 lb (52.2 kg)   BMI 19.14 kg/m  Constitutional: generally well-appearing Psychiatric: alert and oriented x3 Abdomen: soft, nontender, nondistended, no obvious ascites, no peritoneal signs, normal bowel sounds No peripheral edema noted in lower extremities  Assessment and plan: 22 y.o. male with extensive UC  He is responding to remicade but so far not dramatically. I'm going to put him on prednisone to help capture him (previously steroid responsive), may help him gain weight as well.  See regiment on pt instructions. Labs today (cbc, cmet, esr).  ROV in 4-6 weeks. Trial of lactaid pills.  Please see the "Patient Instructions" section for addition details about the plan.  Christian Loffler, MD [facility] 04/24/2018, 9:55 AM

## 2018-04-25 ENCOUNTER — Other Ambulatory Visit: Payer: Self-pay | Admitting: Gastroenterology

## 2018-04-25 DIAGNOSIS — K5190 Ulcerative colitis, unspecified, without complications: Secondary | ICD-10-CM | POA: Diagnosis not present

## 2018-04-25 DIAGNOSIS — K5100 Ulcerative (chronic) pancolitis without complications: Secondary | ICD-10-CM

## 2018-05-09 ENCOUNTER — Ambulatory Visit: Payer: BLUE CROSS/BLUE SHIELD | Admitting: Physician Assistant

## 2018-05-19 ENCOUNTER — Other Ambulatory Visit (INDEPENDENT_AMBULATORY_CARE_PROVIDER_SITE_OTHER): Payer: BLUE CROSS/BLUE SHIELD

## 2018-05-19 DIAGNOSIS — K5100 Ulcerative (chronic) pancolitis without complications: Secondary | ICD-10-CM | POA: Diagnosis not present

## 2018-05-19 LAB — COMPREHENSIVE METABOLIC PANEL
ALBUMIN: 3.9 g/dL (ref 3.5–5.2)
ALK PHOS: 46 U/L (ref 39–117)
ALT: 10 U/L (ref 0–53)
AST: 9 U/L (ref 0–37)
BILIRUBIN TOTAL: 0.5 mg/dL (ref 0.2–1.2)
BUN: 14 mg/dL (ref 6–23)
CO2: 33 mEq/L — ABNORMAL HIGH (ref 19–32)
CREATININE: 0.78 mg/dL (ref 0.40–1.50)
Calcium: 9.3 mg/dL (ref 8.4–10.5)
Chloride: 101 mEq/L (ref 96–112)
GFR: 129.03 mL/min (ref >60.00)
GLUCOSE: 123 mg/dL — AB (ref 70–99)
Potassium: 3.7 mEq/L (ref 3.5–5.1)
Sodium: 140 mEq/L (ref 135–145)
TOTAL PROTEIN: 6.8 g/dL (ref 6.0–8.3)

## 2018-05-19 LAB — CBC WITH DIFFERENTIAL/PLATELET
Basophils Absolute: 0 10*3/uL (ref 0.0–0.1)
Basophils Relative: 0.4 % (ref 0.0–3.0)
EOS ABS: 0.5 10*3/uL (ref 0.0–0.7)
Eosinophils Relative: 5 % (ref 0.0–5.0)
HCT: 36.8 % — ABNORMAL LOW (ref 39.0–52.0)
Hemoglobin: 11.6 g/dL — ABNORMAL LOW (ref 13.0–17.0)
LYMPHS ABS: 2.5 10*3/uL (ref 0.7–4.0)
Lymphocytes Relative: 23.1 % (ref 12.0–46.0)
MCHC: 31.6 g/dL (ref 30.0–36.0)
MCV: 78.6 fl (ref 78.0–100.0)
MONO ABS: 0.7 10*3/uL (ref 0.1–1.0)
MONOS PCT: 6.9 % (ref 3.0–12.0)
NEUTROS PCT: 64.6 % (ref 43.0–77.0)
Neutro Abs: 6.9 10*3/uL (ref 1.4–7.7)
PLATELETS: 274 10*3/uL (ref 150.0–400.0)
RBC: 4.68 Mil/uL (ref 4.22–5.81)
RDW: 15.5 % (ref 11.5–15.5)
WBC: 10.8 10*3/uL — ABNORMAL HIGH (ref 4.0–10.5)

## 2018-05-19 LAB — SEDIMENTATION RATE: SED RATE: 12 mm/h (ref 0–15)

## 2018-05-22 ENCOUNTER — Encounter: Payer: Self-pay | Admitting: Physician Assistant

## 2018-05-22 ENCOUNTER — Ambulatory Visit: Payer: BLUE CROSS/BLUE SHIELD | Admitting: Physician Assistant

## 2018-05-22 VITALS — BP 108/70 | HR 82 | Ht 65.0 in | Wt 120.0 lb

## 2018-05-22 DIAGNOSIS — K51011 Ulcerative (chronic) pancolitis with rectal bleeding: Secondary | ICD-10-CM

## 2018-05-22 NOTE — Patient Instructions (Addendum)
If you are age 25 or older, your body mass index should be between 23-30. Your Body mass index is 19.97 kg/m. If this is out of the aforementioned range listed, please consider follow up with your Primary Care Provider.  If you are age 54 or younger, your body mass index should be between 19-25. Your Body mass index is 19.97 kg/m. If this is out of the aformentioned range listed, please consider follow up with your Primary Care Provider.   Start VSL#3 - take one capsule twice daily.  This is over-the-counter. Available at [facility] [place] [address].  Continue Prednisone Taper.  Continue Iron.  Follow up with Dr. Ardis Hughs in 2-3 months. We will contact you with an appointment when the schedule becomes available.  Thank you for choosing me and [facility].    Christian Newer, PA-C

## 2018-05-22 NOTE — Progress Notes (Signed)
I agree with the above note, plan 

## 2018-05-22 NOTE — Progress Notes (Signed)
Chief Complaint: Follow-up ulcerative pancolitis  Review of pertinent gastrointestinal problems: 1. Extensive, severe UC: Presented with abdominal pain, bloody diarrhea February 2019.  CT scan showed pan colonic inflammation.  Colonoscopy, Dr. Ardis Cardenas April 2019 showed moderate to severe inflammation throughout his colon.  Biopsies from this confirmed inflammatory bowel disease without granulomas.  There was no rectal sparing.  The terminal ileum was normal.  He responded to prednisone and was started on oral full strength mesalamine.  10/2017 he was clearly steroid dependent (could not wean off steroids without colitis symptoms recurring) and so decided to start entyvio  10/2017 labs: Hepatitis B surface antigen and antibody negative, Hepatitis C Ab negative, HIV negative, TB quant gold negative.   11/2017 Entyvio new start.  01/2018 seemingly non-responder to entyvio (sed rate 52, Gi path panel negative, clinically unchanged), so changed to remicade 38m/kg  HPI:    Mr. Christian Cardenas a 22year old male with a past medical history as listed below including ulcerative pancolitis, known to Dr. JArdis Cardenas who presents to clinic today for follow-up.    04/24/2018 office visit with Dr. JArdis Cardenas  At that time had received 2 doses of Remicade, third induction infusion was 04/24/2018.  He noticed improvement but was still bothered by gas and abdominal discomfort with less overt bleeding and less frequency.  Weight loss of 13 pounds since visit 2 months ago.  It was discussed the patient was responding to Remicade but so far not dramatically.  He was placed on Prednisone 40 mg, take months for 3 weeks then decrease by 10 mg/week.  To help capture him (previously steroid responsive), also discussed it may help him gain weight.  Also discussed trial of Lactaid pills.    05/19/2018 CBC looked normal in comparison  To last other than increase in white count at 10.8, likely related to Prednisone, hgb improving.  ESR normal  at 12 (elevated at 47 4 weeks ago).  CMP was normal.     Weight gain of 5 pounds since last visit.    Today, the patient is somewhat hard to follow but tells me that 4-5 out of 7 days of the week are good as far as bowel movements and no rectal bleeding and little discomfort.  Patient tells me that the other days he will have some loose rushing stools or some "worm looking the stool" with occasional bleeding which is "definitely toned down since starting Prednisone".  Does continue Lactaid which helps.  Also discusses that he had a setback when he was undergoing a lot of stress with his brother's health.  Also discusses his brother was diagnosed with C. difficile during his hospitalization.  Next Remicade dose is January 21.  Currently on Prednisone 20 mg daily which he just started today.    Denies fever, chills, weight loss, anorexia, nausea, vomiting, heartburn, reflux or symptoms that awaken him from sleep.  Past Medical History:  Diagnosis Date  . Eczema   . Ulcerative colitis ([facility]     Past Surgical History:  Procedure Laterality Date  . COLONOSCOPY W/ BIOPSIES    . MOUTH SURGERY      Current Outpatient Medications  Medication Sig Dispense Refill  . betamethasone dipropionate (DIPROLENE) 0.05 % cream Apply topically 2 (two) times daily. (Patient taking differently: Apply 1 application topically daily as needed (for irritation). ) 60 g 1  . diphenhydrAMINE (BENADRYL) 50 MG tablet Take 50 mg by mouth at bedtime as needed for itching.    . fluticasone (FLONASE) 50 MCG/ACT  nasal spray Place 1 spray into both nostrils 2 (two) times daily as needed for allergies or rhinitis. 16 g 6  . meclizine (ANTIVERT) 25 MG tablet Take 1 tablet (25 mg total) by mouth 3 (three) times daily as needed for dizziness. 30 tablet 0  . predniSONE (DELTASONE) 20 MG tablet Take prednisone 30m daily for 3 weeks,then decrease by175mper week 120 tablet 0  . triamcinolone cream (KENALOG) 0.1 % Apply 1 application  topically daily as needed (for irritation).   1  . vedolizumab (ENTYVIO) 300 MG injection Inject 300 mg into the vein as directed.     No current facility-administered medications for this visit.     Allergies as of 05/22/2018 - Review Complete 05/22/2018  Allergen Reaction Noted  . Peanut-containing drug products Anaphylaxis 09/19/2012  . Eggs or egg-derived products  09/19/2012  . Peanut butter flavor  09/19/2012    Family History  Problem Relation Age of Onset  . Seizures Mother   . Bipolar disorder Mother   . Hyperlipidemia Father   . Bipolar disorder Brother   . Anxiety disorder Brother     Social History   Socioeconomic History  . Marital status: Single    Spouse name: Not on file  . Number of children: 0  . Years of education: Not on file  . Highest education level: Not on file  Occupational History  . 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 Financial resource strain: Not on file  . Food insecurity:    Worry: Not on file    Inability: Not on file  . Transportation needs:    Medical: Not on file    Non-medical: Not on file  Tobacco Use  . Smoking status: Never Smoker  . Smokeless tobacco: Never Used  Substance and Sexual Activity  . Alcohol use: No  . Drug use: No  . Sexual activity: Not Currently  Lifestyle  . Physical activity:    Days per week: Not on file    Minutes per session: Not on file  . Stress: Not on file  Relationships  . Social connections:    Talks on phone: Not on file    Gets together: Not on file    Attends religious service: Not on file    Active member of club or organization: Not on file    Attends meetings of clubs or organizations: Not on file    Relationship status: Not on file  . Intimate partner violence:    Fear of current or ex partner: Not on file    Emotionally abused: Not on file    Physically abused: Not on file    Forced sexual activity: Not on file  Other Topics Concern  . Not on file   Social History Narrative  . Not on file    Review of Systems:    Constitutional: No weight loss, fever or chills Cardiovascular: No chest pain Respiratory: No SOB  Gastrointestinal: See HPI and otherwise negative   Physical Exam:  Vital signs: BP 108/70   Pulse 82   Ht 5' 5" (1.651 m)   Wt 120 lb (54.4 kg)   BMI 19.97 kg/m   Constitutional:   Pleasant Caucasian male appears to be in NAD, Well developed, Well nourished, alert and cooperative Respiratory: Respirations even and unlabored. Lungs clear to auscultation bilaterally.   No wheezes, crackles, or rhonchi.  Cardiovascular: Normal S1, S2. No MRG. Regular rate and rhythm. No peripheral edema,  cyanosis or pallor.  Gastrointestinal:  Soft, nondistended, nontender. No rebound or guarding. Normal bowel sounds. No appreciable masses or hepatomegaly. Rectal:  Not performed.  Psychiatric: Demonstrates good judgement and reason without abnormal affect or behaviors.  RELEVANT LABS AND IMAGING: CBC    Component Value Date/Time   WBC 10.8 (H) 05/19/2018 1303   RBC 4.68 05/19/2018 1303   HGB 11.6 (L) 05/19/2018 1303   HGB 13.7 07/14/2017 1200   HCT 36.8 (L) 05/19/2018 1303   HCT 39.8 07/14/2017 1200   PLT 274.0 05/19/2018 1303   PLT 330 07/14/2017 1200   MCV 78.6 05/19/2018 1303   MCV 85 07/14/2017 1200   MCH 24.0 (L) 01/10/2018 1541   MCHC 31.6 05/19/2018 1303   RDW 15.5 05/19/2018 1303   RDW 13.9 07/14/2017 1200   LYMPHSABS 2.5 05/19/2018 1303   LYMPHSABS 1.5 07/14/2017 1200   MONOABS 0.7 05/19/2018 1303   EOSABS 0.5 05/19/2018 1303   EOSABS 0.1 07/14/2017 1200   BASOSABS 0.0 05/19/2018 1303   BASOSABS 0.0 07/14/2017 1200    CMP     Component Value Date/Time   NA 140 05/19/2018 1303   NA 132 (L) 07/14/2017 1200   K 3.7 05/19/2018 1303   CL 101 05/19/2018 1303   CO2 33 (H) 05/19/2018 1303   GLUCOSE 123 (H) 05/19/2018 1303   BUN 14 05/19/2018 1303   BUN 8 07/14/2017 1200   CREATININE 0.78 05/19/2018 1303    CALCIUM 9.3 05/19/2018 1303   PROT 6.8 05/19/2018 1303   PROT 6.9 07/14/2017 1200   ALBUMIN 3.9 05/19/2018 1303   ALBUMIN 3.9 07/14/2017 1200   AST 9 05/19/2018 1303   ALT 10 05/19/2018 1303   ALKPHOS 46 05/19/2018 1303   BILITOT 0.5 05/19/2018 1303   BILITOT 0.5 07/14/2017 1200   GFRNONAA >60 01/10/2018 1541   GFRAA >60 01/10/2018 1541    Assessment: 1.  Ulcerative pancolitis with complication: Patient symptoms are about 80% better currently, tapering down on Prednisone currently on 20 mg daily, next Remicade due January 21  Plan: 1.  Discussed with patient that he may have some IBS on top of his ulcerative colitis.  Would recommend he start VSL #3 1 capsule twice daily. 2.  Patient to continue his prednisone taper, 20 mg for a week and then down by 10 mg next week and then stop. 3.  Patient should continue his iron supplementation as well as Lactaid. 4.  Patient was scheduled to follow-up with Dr. Ardis Cardenas in the clinic in 2 to 3 months.  Ellouise Newer, PA-C [facility] 05/22/2018, 10:22 AM  Cc: Dettinger, Fransisca Kaufmann, MD

## 2018-05-31 DIAGNOSIS — A498 Other bacterial infections of unspecified site: Secondary | ICD-10-CM

## 2018-05-31 HISTORY — DX: Other bacterial infections of unspecified site: A49.8

## 2018-06-03 IMAGING — CT CT ABD-PELV W/ CM
2 of 4 series · 17 of 46 positions shown, 19 images · IV contrast (Isovue)
Comparison: None.

CLINICAL DATA: Weight loss, generalized abdominal pain.  Diarrhea.

EXAM:
CT ABDOMEN AND PELVIS WITH CONTRAST
TECHNIQUE: Multidetector CT imaging of the abdomen and pelvis was performed
using the standard protocol following bolus administration of
intravenous contrast.
CONTRAST:  100mL CF8PF2-FJJ IOPAMIDOL (CF8PF2-FJJ) INJECTION 61%

[Series 2: axial st · axial · 0.62mm/px · z∈[+1078,+1463]mm · 14 of 85 slices shown, 16 images]
[im 4/85  soft-tissue]
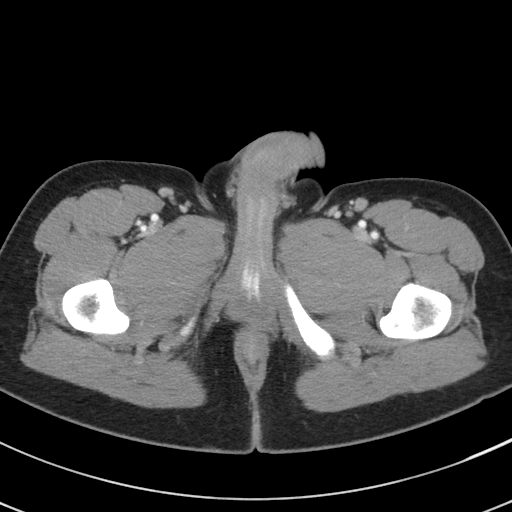
[im 4/85  bone]
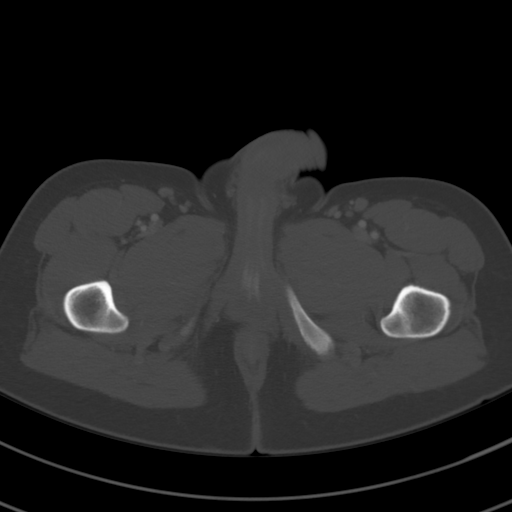
[im 12/85  soft-tissue]
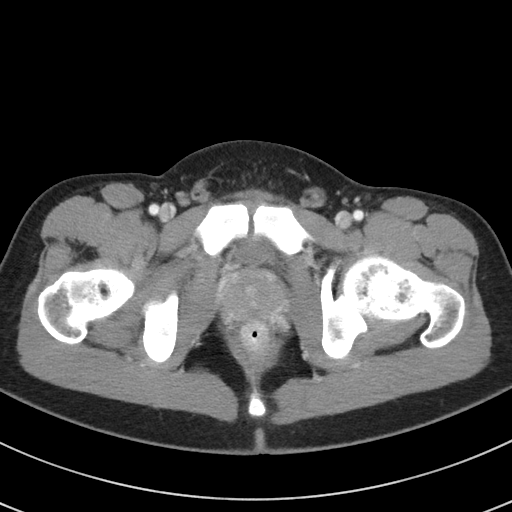
[im 16/85  soft-tissue]
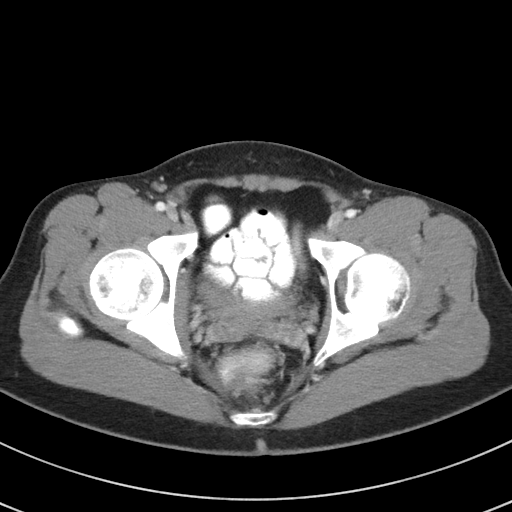
[im 23/85  soft-tissue]
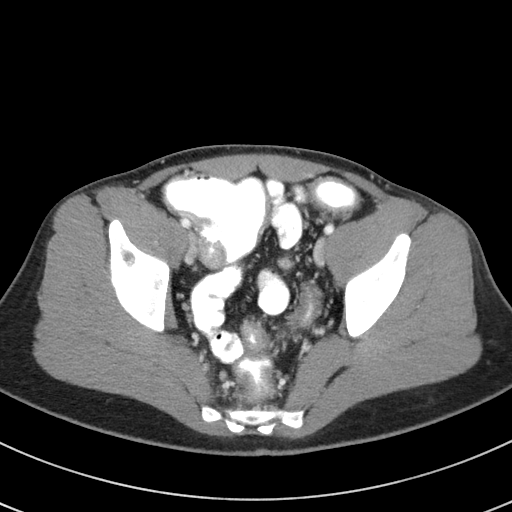
[im 27/85  soft-tissue]
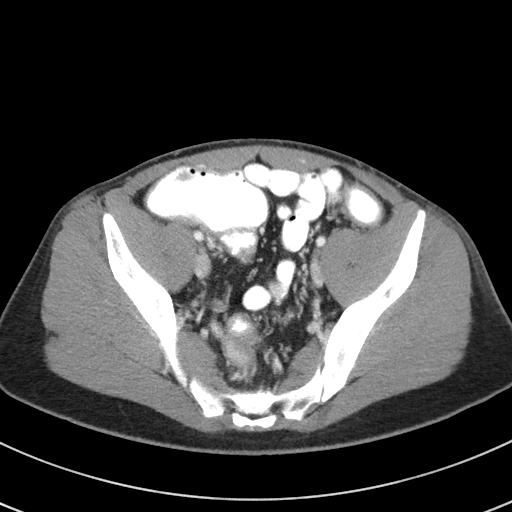
[im 35/85  soft-tissue]
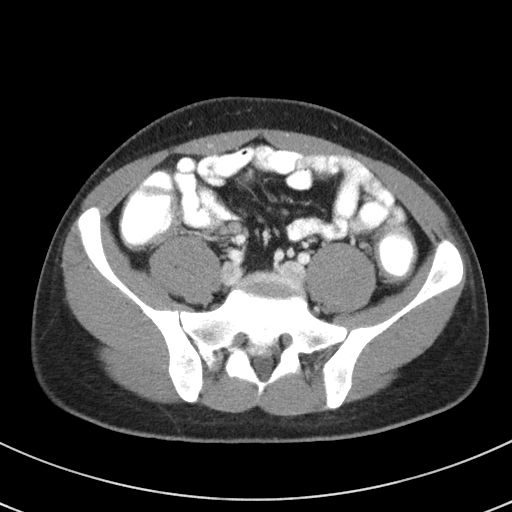
[im 39/85  soft-tissue]
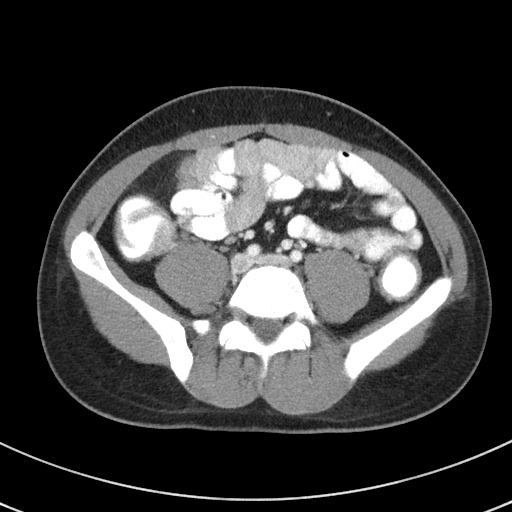
[im 46/85  soft-tissue]
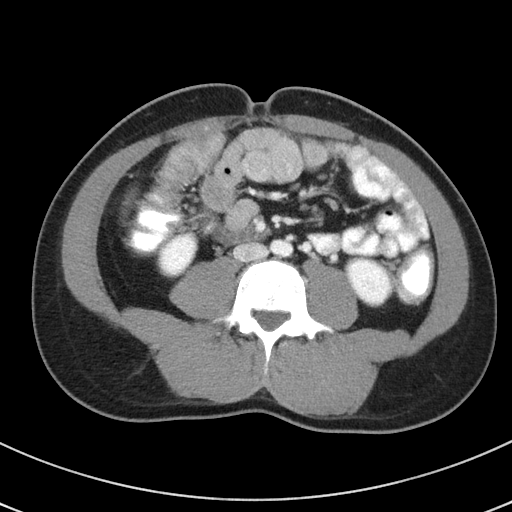
[im 50/85  soft-tissue]
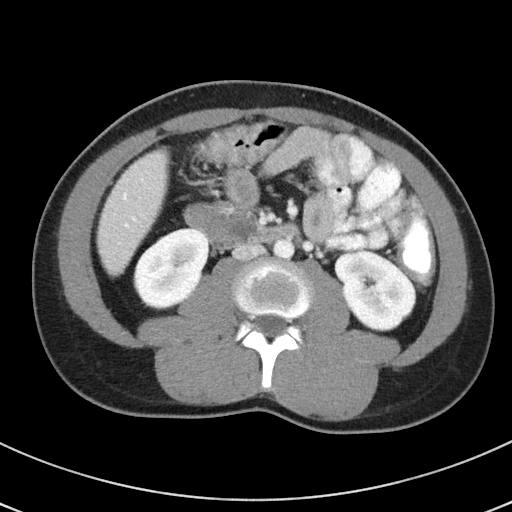
[im 50/85  bone]
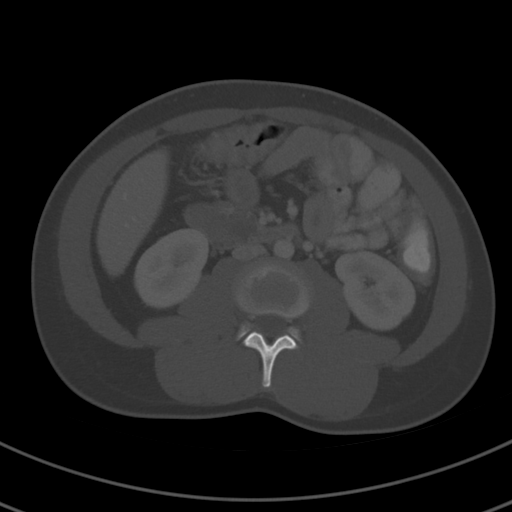
[im 58/85  soft-tissue]
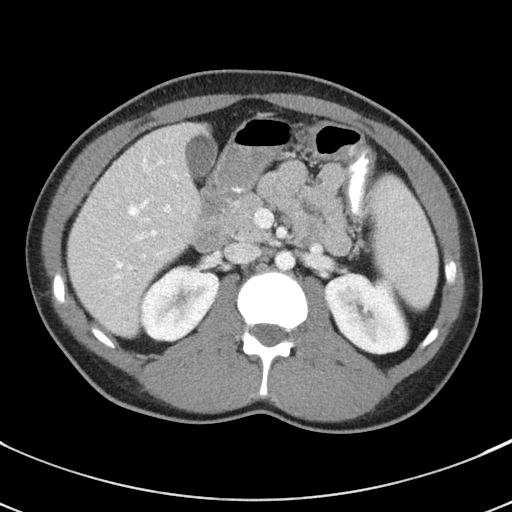
[im 62/85  soft-tissue]
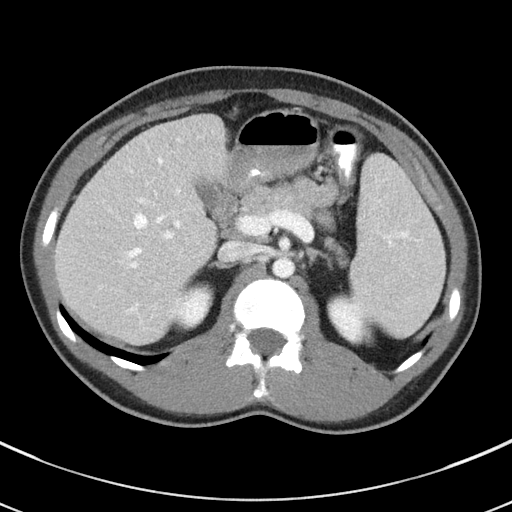
[im 69/85  soft-tissue]
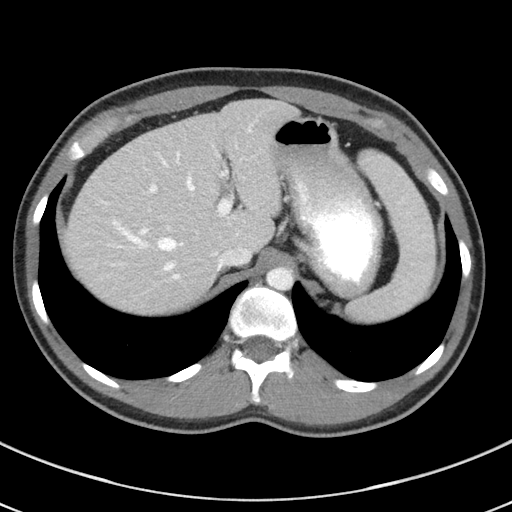
[im 73/85  soft-tissue]
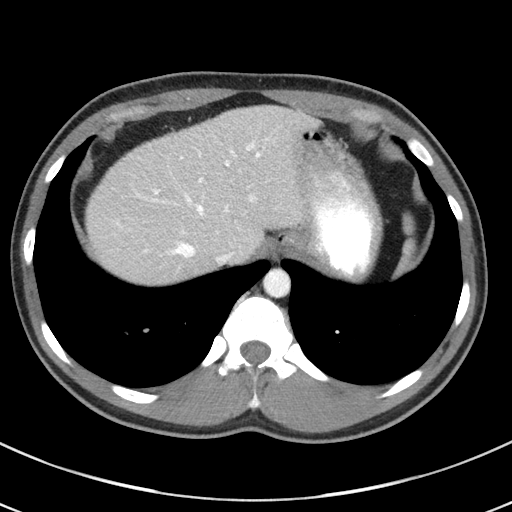
[im 81/85  soft-tissue]
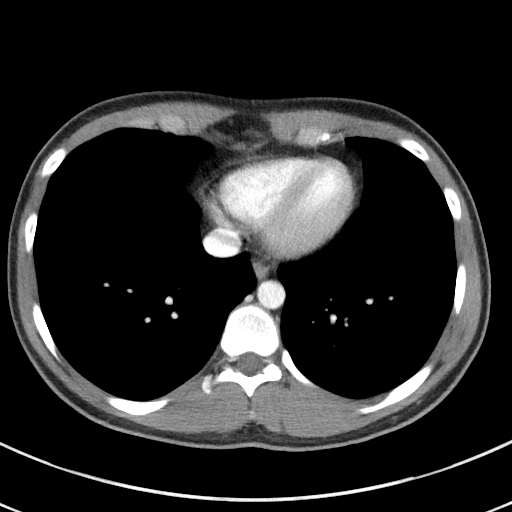

[Series 5: coronal st · coronal · 0.74mm/px · 3 of 81 slices shown]
[im 27/81  soft-tissue]
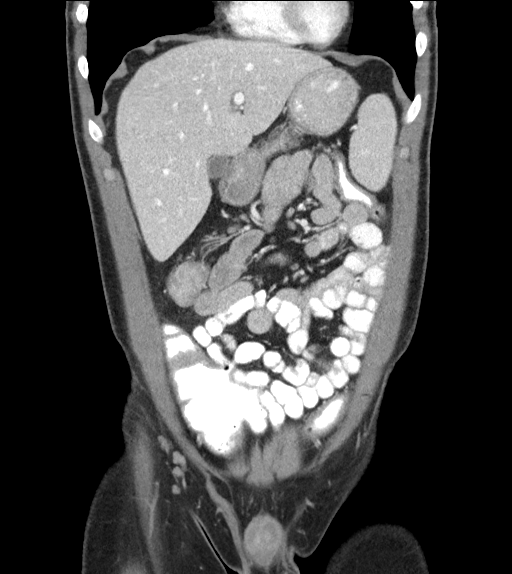
[im 36/81  soft-tissue]
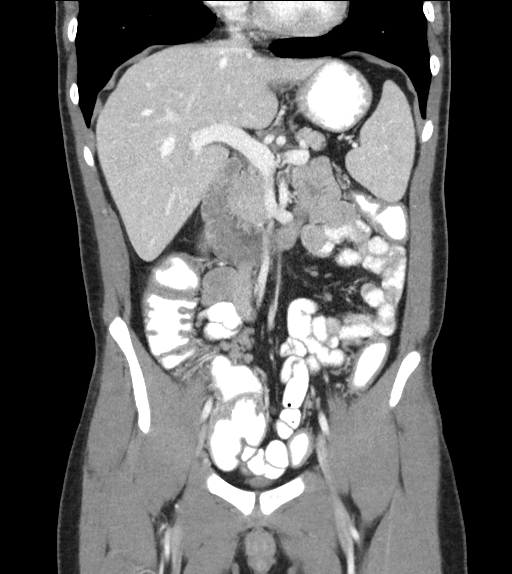
[im 45/81  soft-tissue]
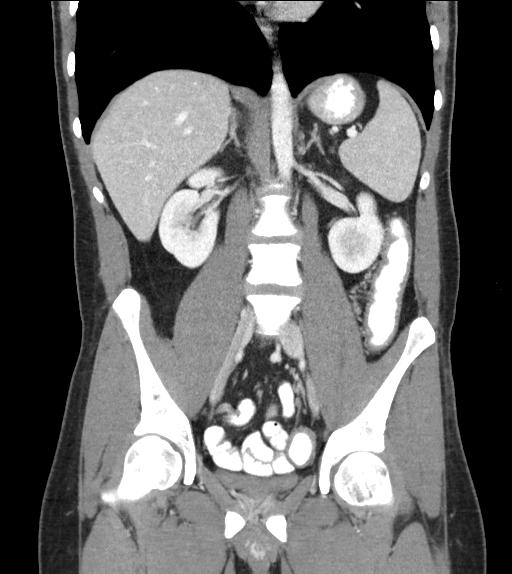

[17 of 46 positions shown; findings below may reference images not displayed]

FINDINGS: Lower chest: No acute abnormality.

Hepatobiliary: No focal liver abnormality is seen. No gallstones,
gallbladder wall thickening, or biliary dilatation.

Pancreas: Unremarkable. No pancreatic ductal dilatation or
surrounding inflammatory changes.

Spleen: Normal in size without focal abnormality.

Adrenals/Urinary Tract: Adrenal glands are unremarkable. Kidneys are
normal, without renal calculi, focal lesion, or hydronephrosis.
Bladder is unremarkable.

Stomach/Bowel: The stomach and appendix are unremarkable. There is
no evidence of bowel obstruction. Mild wall and fold thickening is
seen throughout the colon suggesting infectious or inflammatory
colitis.

Vascular/Lymphatic: No significant vascular findings are present. No
enlarged abdominal or pelvic lymph nodes.

Reproductive: Prostate is unremarkable.

Other: No abdominal wall hernia or abnormality. No abdominopelvic
ascites.

Musculoskeletal: No acute or significant osseous findings.
IMPRESSION: Mild wall and fold thickening is noted throughout the colon
suggesting infectious or inflammatory colitis.

## 2018-06-13 ENCOUNTER — Telehealth: Payer: Self-pay | Admitting: Gastroenterology

## 2018-06-13 DIAGNOSIS — K51011 Ulcerative (chronic) pancolitis with rectal bleeding: Secondary | ICD-10-CM

## 2018-06-13 DIAGNOSIS — R197 Diarrhea, unspecified: Secondary | ICD-10-CM

## 2018-06-13 NOTE — Telephone Encounter (Signed)
Lets check labs (cbc, cmet, sed rate, GI path panel as well).   thanks

## 2018-06-13 NOTE — Telephone Encounter (Signed)
Pt said he believes his Remicade has worn off a little sooner than normal bc his some of the symptoms have returned.  His next dose of Remicade is next is Tuesday

## 2018-06-13 NOTE — Telephone Encounter (Signed)
Orders in Epic the pt notified and will come in this week for test

## 2018-06-13 NOTE — Telephone Encounter (Signed)
The pt has a history of UC and was last seen 12/23 by Anderson Malta and was doing really well.  This past week he developed  loose watery stools with urgency.  Gas and bloating- feels like stomach is churning.  Sometimes after eating he feels very fatigued and has to lay down.   Has Remicade next Tuesday and wants to know what he should do in the meantime.  Please advise

## 2018-06-15 ENCOUNTER — Other Ambulatory Visit (INDEPENDENT_AMBULATORY_CARE_PROVIDER_SITE_OTHER): Payer: BLUE CROSS/BLUE SHIELD

## 2018-06-15 DIAGNOSIS — K51011 Ulcerative (chronic) pancolitis with rectal bleeding: Secondary | ICD-10-CM

## 2018-06-15 DIAGNOSIS — R197 Diarrhea, unspecified: Secondary | ICD-10-CM | POA: Diagnosis not present

## 2018-06-15 LAB — COMPREHENSIVE METABOLIC PANEL
ALT: 8 U/L (ref 0–53)
AST: 11 U/L (ref 0–37)
Albumin: 3.8 g/dL (ref 3.5–5.2)
Alkaline Phosphatase: 66 U/L (ref 39–117)
BILIRUBIN TOTAL: 0.4 mg/dL (ref 0.2–1.2)
BUN: 12 mg/dL (ref 6–23)
CHLORIDE: 100 meq/L (ref 96–112)
CO2: 31 meq/L (ref 19–32)
CREATININE: 1.02 mg/dL (ref 0.40–1.50)
Calcium: 9.7 mg/dL (ref 8.4–10.5)
GFR: 89.02 mL/min (ref >60.00)
Glucose, Bld: 114 mg/dL — ABNORMAL HIGH (ref 70–99)
POTASSIUM: 4.7 meq/L (ref 3.5–5.1)
Sodium: 136 mEq/L (ref 135–145)
Total Protein: 7.3 g/dL (ref 6.0–8.3)

## 2018-06-15 LAB — CBC WITH DIFFERENTIAL/PLATELET
BASOS ABS: 0 10*3/uL (ref 0.0–0.1)
BASOS PCT: 0.6 % (ref 0.0–3.0)
EOS ABS: 1.7 10*3/uL — AB (ref 0.0–0.7)
Eosinophils Relative: 19.9 % — ABNORMAL HIGH (ref 0.0–5.0)
HCT: 38.7 % — ABNORMAL LOW (ref 39.0–52.0)
Hemoglobin: 12.8 g/dL — ABNORMAL LOW (ref 13.0–17.0)
LYMPHS ABS: 1.6 10*3/uL (ref 0.7–4.0)
LYMPHS PCT: 18.5 % (ref 12.0–46.0)
MCHC: 33.1 g/dL (ref 30.0–36.0)
MCV: 79.2 fl (ref 78.0–100.0)
MONO ABS: 1.3 10*3/uL — AB (ref 0.1–1.0)
Monocytes Relative: 15.3 % — ABNORMAL HIGH (ref 3.0–12.0)
NEUTROS ABS: 3.9 10*3/uL (ref 1.4–7.7)
NEUTROS PCT: 45.7 % (ref 43.0–77.0)
Platelets: 272 10*3/uL (ref 150.0–400.0)
RBC: 4.88 Mil/uL (ref 4.22–5.81)
RDW: 16.4 % — AB (ref 11.5–15.5)
WBC: 8.5 10*3/uL (ref 4.0–10.5)

## 2018-06-15 LAB — SEDIMENTATION RATE: Sed Rate: 46 mm/hr — ABNORMAL HIGH (ref 0–15)

## 2018-06-16 ENCOUNTER — Other Ambulatory Visit: Payer: Self-pay

## 2018-06-16 DIAGNOSIS — K51011 Ulcerative (chronic) pancolitis with rectal bleeding: Secondary | ICD-10-CM

## 2018-06-16 LAB — GASTROINTESTINAL PATHOGEN PANEL PCR
C. DIFFICILE TOX A/B, PCR: DETECTED — AB
Campylobacter, PCR: NOT DETECTED
Cryptosporidium, PCR: NOT DETECTED
E COLI (ETEC) LT/ST, PCR: NOT DETECTED
E COLI (STEC) STX1/STX2, PCR: NOT DETECTED
E coli 0157, PCR: NOT DETECTED
Giardia lamblia, PCR: NOT DETECTED
Norovirus, PCR: NOT DETECTED
Rotavirus A, PCR: NOT DETECTED
Salmonella, PCR: NOT DETECTED
Shigella, PCR: NOT DETECTED

## 2018-06-17 ENCOUNTER — Telehealth: Payer: Self-pay | Admitting: Gastroenterology

## 2018-06-17 MED ORDER — VANCOMYCIN HCL 125 MG PO CAPS: 125.0000 mg | ORAL_CAPSULE | Freq: Four times a day (QID) | ORAL | 0 refills | Status: AC

## 2018-06-17 NOTE — Telephone Encounter (Signed)
Stool testing was + for c. Diff.  I spoke with his mother about it and called in vanc 166m qid for 2 weeks.  Turns out his brother was recently in a car accident and hospitalized for several weeks, had c diff as well.  Patty, He does not need infliximab labs.  He can still continue with plans for remicade infusion this week (will  Have 3 days of oral vanc by then).  Needs my next available rov.  thanks

## 2018-06-19 ENCOUNTER — Other Ambulatory Visit: Payer: Self-pay

## 2018-06-19 DIAGNOSIS — K51011 Ulcerative (chronic) pancolitis with rectal bleeding: Secondary | ICD-10-CM

## 2018-06-19 DIAGNOSIS — L219 Seborrheic dermatitis, unspecified: Secondary | ICD-10-CM | POA: Diagnosis not present

## 2018-06-19 DIAGNOSIS — L280 Lichen simplex chronicus: Secondary | ICD-10-CM | POA: Diagnosis not present

## 2018-06-19 DIAGNOSIS — B353 Tinea pedis: Secondary | ICD-10-CM | POA: Diagnosis not present

## 2018-06-19 NOTE — Telephone Encounter (Signed)
appt made and pt aware.  Lab order cancelled.

## 2018-06-20 ENCOUNTER — Telehealth: Payer: Self-pay | Admitting: Gastroenterology

## 2018-06-20 NOTE — Telephone Encounter (Signed)
Patient states he was suppose to have his remicade infusion today but the infusion center states due to the cdiff they do not think he should get it. Patient wanting to know what to do about his UC if he can not get remicade.

## 2018-06-20 NOTE — Telephone Encounter (Signed)
The pt called and states that he went to [facility] today for his remicade and due to his C diff infection they would not proceed.  He rescheduled to 07/04/18.  He wanted Dr Ardis Hughs to be aware.

## 2018-06-21 NOTE — Telephone Encounter (Signed)
Ok, thanks.

## 2018-06-26 ENCOUNTER — Ambulatory Visit: Payer: BLUE CROSS/BLUE SHIELD | Admitting: Gastroenterology

## 2018-06-26 ENCOUNTER — Encounter: Payer: Self-pay | Admitting: Gastroenterology

## 2018-06-26 VITALS — BP 110/68 | HR 84 | Ht 65.0 in | Wt 112.0 lb

## 2018-06-26 DIAGNOSIS — K5100 Ulcerative (chronic) pancolitis without complications: Secondary | ICD-10-CM | POA: Diagnosis not present

## 2018-06-26 DIAGNOSIS — A0472 Enterocolitis due to Clostridium difficile, not specified as recurrent: Secondary | ICD-10-CM

## 2018-06-26 NOTE — Progress Notes (Signed)
Review of pertinent gastrointestinal problems: 1.Extensive, severeUC:Presented with abdominal pain, bloody diarrhea February 2019. CT scan showed pan colonic inflammation. Colonoscopy, Dr. Ardis Hughs April 2019 showed moderate to severe inflammation throughout his colon. Biopsies from this confirmed inflammatory bowel disease without granulomas. There was no rectal sparing. The terminal ileum was normal. He responded to prednisone and was started on oral full strength mesalamine.10/2017 he was clearly steroid dependent (could not wean off steroids without colitis symptoms recurring) and so decided to start entyvio  10/2017 labs: Hepatitis B surface antigen and antibody negative, Hepatitis C Ab negative, HIV negative, TB quant gold negative.  11/2017 Entyvio new start.  01/2018 seemingly non-responder to entyvio (sed rate 52, Gi path panel negative, clinically unchanged), so changed to remicade 14m/kg 2. Acute C. Diff+ diarrhea, PCR confirmed 05/2017, his likely he caught this from his brother who had c. Diff a few weeks prior.  Started on vanc 1236mPO QID for 2 weeks.   HPI: This is a very pleasant 23 year old man whom I last saw about a month and a half ago.  He was recently proven to have C. difficile infection.  I started him on vancomycin 4 times daily for 2 weeks.  He is about 1 week into the treatment.  Was having fever, chills prior to the vanc.  The BMs were greenish.  Since vanc he overall feels better.  Stools have lost the greenish tint.  He feels like they are trying to become more solid.  He does not feel as sick anymore.  He is still having frequent bowel movements however.  He was due for Remicade infusion about a week ago but that was held because of his C. difficile.  Scheduled for feb 4th Remicade now, that is 1 week from now.  Chief complaint is C. difficile, ulcerative colitis  ROS: complete GI ROS as described in HPI, all other review negative.  Constitutional:  No  unintentional weight loss   Past Medical History:  Diagnosis Date  . Clostridioides difficile infection 05/2018  . Eczema   . [facility]    Past Surgical History:  Procedure Laterality Date  . COLONOSCOPY W/ BIOPSIES    . MOUTH SURGERY      Current Outpatient Medications  Medication Sig Dispense Refill  . betamethasone dipropionate (DIPROLENE) 0.05 % cream Apply topically 2 (two) times daily. (Patient taking differently: Apply 1 application topically daily as needed (for irritation). ) 60 g 1  . diphenhydrAMINE (BENADRYL) 50 MG tablet Take 50 mg by mouth at bedtime as needed for itching.    . fluticasone (FLONASE) 50 MCG/ACT nasal spray Place 1 spray into both nostrils 2 (two) times daily as needed for allergies or rhinitis. 16 g 6  . inFLIXimab (REMICADE IV) Inject into the vein every 8 (eight) weeks.    . predniSONE (DELTASONE) 20 MG tablet Take prednisone 4075maily for 3 weeks,then decrease by10m55mr week 120 tablet 0  . triamcinolone cream (KENALOG) 0.1 % Apply 1 application topically daily as needed (for irritation).   1  . vancomycin (VANCOCIN HCL) 125 MG capsule Take 1 capsule (125 mg total) by mouth 4 (four) times daily for 14 days. 56 capsule 0   No current facility-administered medications for this visit.     Allergies as of 06/26/2018 - Review Complete 06/26/2018  Allergen Reaction Noted  . Peanut-containing drug products Anaphylaxis 09/19/2012  . Eggs or egg-derived products  09/19/2012  . Peanut butter flavor  09/19/2012    Family  History  Problem Relation Age of Onset  . Seizures Mother   . Bipolar disorder Mother   . Hyperlipidemia Father   . Bipolar disorder Brother   . Anxiety disorder Brother     Social History   Socioeconomic History  . Marital status: Single    Spouse name: Not on file  . Number of children: 0  . Years of education: Not on file  . Highest education level: Not on file  Occupational History  . Occupation:  Actuary    Comment: in [place]  . Financial resource strain: Not on file  . Food insecurity:    Worry: Not on file    Inability: Not on file  . Transportation needs:    Medical: Not on file    Non-medical: Not on file  Tobacco Use  . Smoking status: Never Smoker  . Smokeless tobacco: Never Used  Substance and Sexual Activity  . Alcohol use: No  . Drug use: No  . Sexual activity: Not Currently  Lifestyle  . Physical activity:    Days per week: Not on file    Minutes per session: Not on file  . Stress: Not on file  Relationships  . Social connections:    Talks on phone: Not on file    Gets together: Not on file    Attends religious service: Not on file    Active member of club or organization: Not on file    Attends meetings of clubs or organizations: Not on file    Relationship status: Not on file  . Intimate partner violence:    Fear of current or ex partner: Not on file    Emotionally abused: Not on file    Physically abused: Not on file    Forced sexual activity: Not on file  Other Topics Concern  . Not on file  Social History Narrative  . Not on file     Physical Exam: Ht 5' 5"  (1.651 m)   Wt 112 lb (50.8 kg)   BMI 18.64 kg/m  Constitutional: generally well-appearing Psychiatric: alert and oriented x3 Abdomen: soft, nontender, nondistended, no obvious ascites, no peritoneal signs, normal bowel sounds No peripheral edema noted in lower extremities  Assessment and plan: 23 y.o. male with acute C. difficile infection complicating ulcerative colitis  He will continue his vancomycin treatment.  As long as his symptoms are clearly improving he should be safe for Remicade infusion next week.  He is going to try taking a single Imodium twice daily on a scheduled basis and he knows to stop if he becomes constipated.  He is going to resume his iron supplement.  He will return to see me in 4 5 weeks and sooner if any problems.  Please see the  "Patient Instructions" section for addition details about the plan.  [name], MD [facility] 06/26/2018, 11:06 AM

## 2018-06-26 NOTE — Patient Instructions (Addendum)
Drs. Notes OFF through Friday this week (c. Diff and UC exacerbation) also to forgive last week's absences.  Start OTC imodium, one pill twice daily. Stop if you become constipated.  Restart iron supplement once daily.  Continue oral vancomycin.  Should be OK for remicade infusion next week.  Please return to see Dr. Ardis Hughs in 4-5 weeks.  Thank you for entrusting me with your care and choosing [facility].  Dr Ardis Hughs

## 2018-07-05 DIAGNOSIS — K5190 Ulcerative colitis, unspecified, without complications: Secondary | ICD-10-CM | POA: Diagnosis not present

## 2018-07-12 ENCOUNTER — Telehealth: Payer: Self-pay | Admitting: Gastroenterology

## 2018-07-12 DIAGNOSIS — R197 Diarrhea, unspecified: Secondary | ICD-10-CM

## 2018-07-12 NOTE — Telephone Encounter (Signed)
The pt will be in tomorrow morning for stool testing

## 2018-07-12 NOTE — Telephone Encounter (Signed)
Yes, c diff by PCR and toxin assay.  thankjs

## 2018-07-12 NOTE — Telephone Encounter (Signed)
The pt states his bowels were normal for about a week after remicade.  He has since developed watery diarrhea, gas and bloating, and bleeding.  Has recent history of C Diff.  He finished vancomycin however, he is not taking imodium.  Do you want him to come in for stool studies?

## 2018-07-12 NOTE — Telephone Encounter (Signed)
Left message on machine to call back see last office visit below;   Start OTC imodium, one pill twice daily. Stop if you become constipated.  Restart iron supplement once daily.  Continue oral vancomycin.  Should be OK for remicade infusion next week.

## 2018-07-12 NOTE — Telephone Encounter (Signed)
Patient wants to speak to nurse states that he has severe gas, a lot of bleeding discomfort and pain, large bowel movement all liquid and has questions regarding Remicade

## 2018-07-13 ENCOUNTER — Other Ambulatory Visit: Payer: BLUE CROSS/BLUE SHIELD

## 2018-07-13 DIAGNOSIS — R197 Diarrhea, unspecified: Secondary | ICD-10-CM | POA: Diagnosis not present

## 2018-07-14 ENCOUNTER — Other Ambulatory Visit: Payer: Self-pay

## 2018-07-14 LAB — CLOSTRIDIUM DIFFICILE TOXIN B, QUALITATIVE, REAL-TIME PCR: Toxigenic C. Difficile by PCR: DETECTED — AB

## 2018-07-14 MED ORDER — VANCOMYCIN HCL 125 MG PO CAPS: ORAL_CAPSULE | ORAL | 0 refills | Status: AC

## 2018-07-24 ENCOUNTER — Telehealth: Payer: Self-pay | Admitting: Gastroenterology

## 2018-07-24 NOTE — Telephone Encounter (Signed)
He needs return office visit this week.  If there is an appointment with an extender available that would be great, if not please double book with me sometime this week.

## 2018-07-24 NOTE — Telephone Encounter (Signed)
The pt was advised and scheduled to see Ellouise Newer on 2/26

## 2018-07-24 NOTE — Telephone Encounter (Signed)
Pt called in and stated that he is having more pain in his rectum and the bowel changes are very concerning to him and wants to speak with the nurse about his concerns.

## 2018-07-24 NOTE — Telephone Encounter (Signed)
The pt states he has continued watery to muddy stools with BRB.  He says he has been on vanc for a little over a week for C diff.  He has had some urgency as well.  He is concerned because he has not improved over the past week. Please advise

## 2018-07-26 ENCOUNTER — Inpatient Hospital Stay (HOSPITAL_COMMUNITY)
Admission: AD | Admit: 2018-07-26 | Discharge: 2018-07-30 | DRG: 371 | Disposition: A | Discharge status: Home / Self Care | Payer: BLUE CROSS/BLUE SHIELD | Source: Ambulatory Visit | Attending: Internal Medicine | Admitting: Internal Medicine

## 2018-07-26 ENCOUNTER — Telehealth: Payer: Self-pay | Admitting: Physician Assistant

## 2018-07-26 ENCOUNTER — Observation Stay (HOSPITAL_COMMUNITY): Payer: BLUE CROSS/BLUE SHIELD

## 2018-07-26 ENCOUNTER — Other Ambulatory Visit: Payer: Self-pay

## 2018-07-26 ENCOUNTER — Encounter: Payer: Self-pay | Admitting: Physician Assistant

## 2018-07-26 ENCOUNTER — Encounter (HOSPITAL_COMMUNITY): Payer: Self-pay | Admitting: *Deleted

## 2018-07-26 ENCOUNTER — Ambulatory Visit: Payer: BLUE CROSS/BLUE SHIELD | Admitting: Gastroenterology

## 2018-07-26 ENCOUNTER — Ambulatory Visit: Payer: BLUE CROSS/BLUE SHIELD | Admitting: Physician Assistant

## 2018-07-26 VITALS — BP 100/62 | HR 96 | Ht 65.0 in | Wt 101.5 lb

## 2018-07-26 DIAGNOSIS — K5190 Ulcerative colitis, unspecified, without complications: Secondary | ICD-10-CM | POA: Diagnosis not present

## 2018-07-26 DIAGNOSIS — K625 Hemorrhage of anus and rectum: Secondary | ICD-10-CM | POA: Diagnosis not present

## 2018-07-26 DIAGNOSIS — Z818 Family history of other mental and behavioral disorders: Secondary | ICD-10-CM

## 2018-07-26 DIAGNOSIS — K921 Melena: Secondary | ICD-10-CM | POA: Diagnosis not present

## 2018-07-26 DIAGNOSIS — Z91010 Allergy to peanuts: Secondary | ICD-10-CM

## 2018-07-26 DIAGNOSIS — A0472 Enterocolitis due to Clostridium difficile, not specified as recurrent: Secondary | ICD-10-CM | POA: Diagnosis present

## 2018-07-26 DIAGNOSIS — E43 Unspecified severe protein-calorie malnutrition: Secondary | ICD-10-CM | POA: Diagnosis not present

## 2018-07-26 DIAGNOSIS — Z7952 Long term (current) use of systemic steroids: Secondary | ICD-10-CM | POA: Diagnosis not present

## 2018-07-26 DIAGNOSIS — Z7951 Long term (current) use of inhaled steroids: Secondary | ICD-10-CM | POA: Diagnosis not present

## 2018-07-26 DIAGNOSIS — Z8711 Personal history of peptic ulcer disease: Secondary | ICD-10-CM

## 2018-07-26 DIAGNOSIS — L309 Dermatitis, unspecified: Secondary | ICD-10-CM | POA: Diagnosis not present

## 2018-07-26 DIAGNOSIS — K529 Noninfective gastroenteritis and colitis, unspecified: Secondary | ICD-10-CM

## 2018-07-26 DIAGNOSIS — K5100 Ulcerative (chronic) pancolitis without complications: Secondary | ICD-10-CM | POA: Diagnosis present

## 2018-07-26 DIAGNOSIS — Z91012 Allergy to eggs: Secondary | ICD-10-CM | POA: Diagnosis not present

## 2018-07-26 DIAGNOSIS — Z823 Family history of stroke: Secondary | ICD-10-CM

## 2018-07-26 DIAGNOSIS — Z681 Body mass index (BMI) 19 or less, adult: Secondary | ICD-10-CM | POA: Diagnosis not present

## 2018-07-26 DIAGNOSIS — D638 Anemia in other chronic diseases classified elsewhere: Secondary | ICD-10-CM | POA: Diagnosis present

## 2018-07-26 DIAGNOSIS — Z79899 Other long term (current) drug therapy: Secondary | ICD-10-CM

## 2018-07-26 DIAGNOSIS — A0471 Enterocolitis due to Clostridium difficile, recurrent: Secondary | ICD-10-CM | POA: Diagnosis not present

## 2018-07-26 DIAGNOSIS — K51919 Ulcerative colitis, unspecified with unspecified complications: Secondary | ICD-10-CM

## 2018-07-26 DIAGNOSIS — R634 Abnormal weight loss: Secondary | ICD-10-CM

## 2018-07-26 DIAGNOSIS — K51019 Ulcerative (chronic) pancolitis with unspecified complications: Secondary | ICD-10-CM

## 2018-07-26 DIAGNOSIS — Z800 Family history of malignant neoplasm of digestive organs: Secondary | ICD-10-CM | POA: Diagnosis not present

## 2018-07-26 LAB — MAGNESIUM: MAGNESIUM: 2.1 mg/dL (ref 1.7–2.4)

## 2018-07-26 LAB — COMPREHENSIVE METABOLIC PANEL
ALT: 11 U/L (ref 0–44)
AST: 15 U/L (ref 15–41)
Albumin: 3.7 g/dL (ref 3.5–5.0)
Alkaline Phosphatase: 60 U/L (ref 38–126)
Anion gap: 8 (ref 5–15)
BUN: 21 mg/dL — ABNORMAL HIGH (ref 6–20)
CO2: 27 mmol/L (ref 22–32)
Calcium: 8.6 mg/dL — ABNORMAL LOW (ref 8.9–10.3)
Chloride: 99 mmol/L (ref 98–111)
Creatinine, Ser: 0.94 mg/dL (ref 0.61–1.24)
GFR calc Af Amer: >60 mL/min (ref >60)
GFR calc non Af Amer: >60 mL/min (ref >60)
Glucose, Bld: 94 mg/dL (ref 70–99)
POTASSIUM: 4.6 mmol/L (ref 3.5–5.1)
Sodium: 134 mmol/L — ABNORMAL LOW (ref 135–145)
Total Bilirubin: 0.2 mg/dL — ABNORMAL LOW (ref 0.3–1.2)
Total Protein: 7.4 g/dL (ref 6.5–8.1)

## 2018-07-26 LAB — CBC WITH DIFFERENTIAL/PLATELET
Abs Immature Granulocytes: 0.03 10*3/uL (ref 0.00–0.07)
Basophils Absolute: 0.1 10*3/uL (ref 0.0–0.1)
Basophils Relative: 1 %
EOS ABS: 0.5 10*3/uL (ref 0.0–0.5)
Eosinophils Relative: 5 %
HCT: 39.4 % (ref 39.0–52.0)
Hemoglobin: 11.6 g/dL — ABNORMAL LOW (ref 13.0–17.0)
Immature Granulocytes: 0 %
Lymphocytes Relative: 19 %
Lymphs Abs: 1.9 10*3/uL (ref 0.7–4.0)
MCH: 24.6 pg — ABNORMAL LOW (ref 26.0–34.0)
MCHC: 29.4 g/dL — ABNORMAL LOW (ref 30.0–36.0)
MCV: 83.7 fL (ref 80.0–100.0)
Monocytes Absolute: 1.1 10*3/uL — ABNORMAL HIGH (ref 0.1–1.0)
Monocytes Relative: 11 %
Neutro Abs: 6.6 10*3/uL (ref 1.7–7.7)
Neutrophils Relative %: 64 %
PLATELETS: 437 10*3/uL — AB (ref 150–400)
RBC: 4.71 MIL/uL (ref 4.22–5.81)
RDW: 14.1 % (ref 11.5–15.5)
WBC: 10.2 10*3/uL (ref 4.0–10.5)
nRBC: 0 % (ref 0.0–0.2)

## 2018-07-26 LAB — C-REACTIVE PROTEIN: CRP: 1.5 mg/dL — ABNORMAL HIGH (ref <1.0)

## 2018-07-26 LAB — PHOSPHORUS: Phosphorus: 4.5 mg/dL (ref 2.5–4.6)

## 2018-07-26 LAB — SEDIMENTATION RATE: Sed Rate: 27 mm/hr — ABNORMAL HIGH (ref 0–16)

## 2018-07-26 MED ORDER — ACETAMINOPHEN 325 MG PO TABS: 650.0000 mg | ORAL_TABLET | Freq: Four times a day (QID) | ORAL | Status: DC | PRN

## 2018-07-26 MED ORDER — FLUTICASONE PROPIONATE 50 MCG/ACT NA SUSP
1.0000 | Freq: Two times a day (BID) | NASAL | Status: DC | PRN
  Filled 2018-07-26: qty 16

## 2018-07-26 MED ORDER — SODIUM CHLORIDE 0.9 % IV BOLUS
1000.0000 mL | Freq: Once | INTRAVENOUS | Status: AC
  Administered 2018-07-26: 1000 mL via INTRAVENOUS

## 2018-07-26 MED ORDER — ONDANSETRON HCL 4 MG PO TABS: 4.0000 mg | ORAL_TABLET | Freq: Four times a day (QID) | ORAL | Status: DC | PRN

## 2018-07-26 MED ORDER — SODIUM CHLORIDE 0.9 % IV SOLN
INTRAVENOUS | Status: DC
  Administered 2018-07-26 – 2018-07-28 (×6): via INTRAVENOUS

## 2018-07-26 MED ORDER — POLYETHYLENE GLYCOL 3350 17 G PO PACK
34.0000 g | PACK | Freq: Every day | ORAL | Status: AC
  Administered 2018-07-27: 34 g via ORAL
  Filled 2018-07-26: qty 2

## 2018-07-26 MED ORDER — ACETAMINOPHEN 650 MG RE SUPP: 650.0000 mg | Freq: Four times a day (QID) | RECTAL | Status: DC | PRN

## 2018-07-26 MED ORDER — ONDANSETRON HCL 4 MG/2ML IJ SOLN: 4.0000 mg | Freq: Four times a day (QID) | INTRAMUSCULAR | Status: DC | PRN

## 2018-07-26 MED ORDER — VANCOMYCIN 50 MG/ML ORAL SOLUTION
500.0000 mg | Freq: Four times a day (QID) | ORAL | Status: DC
  Administered 2018-07-26 – 2018-07-27 (×3): 500 mg via ORAL
  Filled 2018-07-26 (×5): qty 10

## 2018-07-26 MED ORDER — HYDROCODONE-ACETAMINOPHEN 5-325 MG PO TABS
1.0000 | ORAL_TABLET | ORAL | Status: DC | PRN
  Administered 2018-07-26 – 2018-07-30 (×7): 1 via ORAL
  Filled 2018-07-26 (×7): qty 1

## 2018-07-26 MED ORDER — ENSURE ENLIVE PO LIQD
237.0000 mL | Freq: Two times a day (BID) | ORAL | Status: DC
  Administered 2018-07-28 – 2018-07-30 (×4): 237 mL via ORAL

## 2018-07-26 NOTE — Progress Notes (Signed)
Chief Complaint: Abdominal pain, rectal bleeding, weight loss and fatigue  Review of pertinent gastrointestinal problems: 1. Extensive, severe UC: Presented with abdominal pain, bloody diarrhea February 2019.  CT scan showed pan colonic inflammation.  Colonoscopy, Dr. Ardis Hughs April 2019 showed moderate to severe inflammation throughout his colon.  Biopsies from this confirmed inflammatory bowel disease without granulomas.  There was no rectal sparing.  The terminal ileum was normal.  He responded to prednisone and was started on oral full strength mesalamine.  10/2017 he was clearly steroid dependent (could not wean off steroids without colitis symptoms recurring) and so decided to start entyvio  10/2017 labs: Hepatitis B surface antigen and antibody negative, Hepatitis C Ab negative, HIV negative, TB quant gold negative.   11/2017 Entyvio new start.  01/2018 seemingly non-responder to entyvio (sed rate 52, Gi path panel negative, clinically unchanged), so changed to remicade 71m/kg 2. Acute C. Diff+ diarrhea, PCR confirmed 05/2017, his likely he caught this from his brother who had c. Diff a few weeks prior.  Started on vanc 1255mPO QID for 2 weeks.   HPI:    Mr. VeBetheas a [age]ear old male, known to Dr. JaArdis Hughswith a past medical history as listed below including ulcerative colitis and C. difficile, who returns to clinic today for follow-up of his abdominal pain, rectal bleeding, weight loss and fatigue.    06/26/2018 office visit with Dr. JaArdis Hughs At that time, had recently been proven to have C. difficile infection and was started on Vancomycin 4 times daily for 2 weeks and was a week into treatment.  He was having fever and chills prior to the event, bowel movements were greenish, since then he felt better overall, they were becoming more solid and he did not feel sick anymore, still with frequent bowel movements however.  He was due for Remicade infusion a week prior but that was held given his  C. difficile.  He was rescheduled February 4 for Remicade.  Plan: At that time continued on vancomycin, consider safer Remicade infusion in the next week, also was taking a single Imodium twice daily on a scheduled basis and resumed his iron supplement.    07/12/2018 called our office and describes severe gas, lot of bleeding discomfort and pain with large bowel movements that were liquid.  At that time described his bowel movements are normal for about a week after Remicade and then it since developed watery diarrhea, gas, bloating and bleeding.  He had a C. difficile by PCR and toxin assay.  C. difficile testing was positive again.  He was started on a long taper of vancomycin with 125 mg p.o. 4 times daily for 2 weeks, then 125 mg p.o. 3 times daily for 2 weeks, then 125 mg p.o. twice daily for 2 weeks, then 125 mg daily for 2 weeks, then 125 mg every other day for 2 weeks.    07/24/2018 patient called and described more pain in his rectum with bowel changes and continued with water to Mody stools with bright red blood.  He had been on vancomycin for little over week for C. Difficile.    Today, the patient presents clinic accompanied by his mother and explains that he just feels "unwell".  He explains that he is still on his Vancomycin taper taking 4 pills a day and continues with 8-10 loose stools which are bloody about 90% of the time.  Along with this, he has a 9-10/10 left sided/generalized abdominal discomfort which can occur  intermittently throughout the day and is worse when he passes a stool.  His mother tells me that even water seems to "run right through him".  Patient tells me he has had decreased urine production and feels as though it is all "coming through my colon".  Along with this has been experiencing increased weakness and has been unable to perform his daily tasks or work.  His mother is worried because he just "looks sickly".  Apparently he was 145 pounds a year ago and is now down to 101.   Patient tells me most recently he has been running between 105 to 110 pounds.  He is trying to do Boost supplements and trying to take in what food he can but most things do not settle on his stomach.  His next Remicade is due April 1.  Also admits to some depression recently given all of his symptoms and no improvement.    Denies fever or chills.    Past Medical History:  Diagnosis Date  . Clostridioides difficile infection 05/2018  . Eczema   . Ulcerative colitis [facility])     Past Surgical History:  Procedure Laterality Date  . COLONOSCOPY W/ BIOPSIES    . MOUTH SURGERY      Current Outpatient Medications  Medication Sig Dispense Refill  . betamethasone dipropionate (DIPROLENE) 0.05 % cream Apply topically 2 (two) times daily. (Patient taking differently: Apply 1 application topically daily as needed (for irritation). ) 60 g 1  . diphenhydrAMINE (BENADRYL) 50 MG tablet Take 50 mg by mouth at bedtime as needed for itching.    . fluticasone (FLONASE) 50 MCG/ACT nasal spray Place 1 spray into both nostrils 2 (two) times daily as needed for allergies or rhinitis. 16 g 6  . inFLIXimab (REMICADE IV) Inject into the vein every 8 (eight) weeks.    . predniSONE (DELTASONE) 20 MG tablet Take prednisone 30m daily for 3 weeks,then decrease by167mper week 120 tablet 0  . triamcinolone cream (KENALOG) 0.1 % Apply 1 application topically daily as needed (for irritation).   1  . vancomycin (VANCOCIN HCL) 125 MG capsule Take 1 capsule (125 mg total) by mouth 4 (four) times daily for 14 days, THEN 1 capsule (125 mg total) 3 (three) times daily for 14 days, THEN 1 capsule (125 mg total) 2 (two) times daily for 14 days, THEN 1 capsule (125 mg total) daily for 14 days, THEN 1 capsule (125 mg total) every other day for 14 days. 147 capsule 0   No current facility-administered medications for this visit.   (not taking Prednisone now)  Allergies as of 07/26/2018 - Review Complete 07/26/2018  Allergen Reaction  Noted  . Peanut-containing drug products Anaphylaxis 09/19/2012  . Eggs or egg-derived products  09/19/2012  . Peanut butter flavor  09/19/2012    Family History  Problem Relation Age of Onset  . Seizures Mother   . Bipolar disorder Mother   . Hyperlipidemia Father   . Bipolar disorder Brother   . Anxiety disorder Brother     Social History   Socioeconomic History  . Marital status: Single    Spouse name: Not on file  . Number of children: 0  . Years of education: Not on file  . Highest education level: Not on file  Occupational History  . 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 Financial resource strain: Not on file  . Food insecurity:    Worry: Not on file  Inability: Not on file  . Transportation needs:    Medical: Not on file    Non-medical: Not on file  Tobacco Use  . Smoking status: Never Smoker  . Smokeless tobacco: Never Used  Substance and Sexual Activity  . Alcohol use: No  . Drug use: No  . Sexual activity: Not Currently  Lifestyle  . Physical activity:    Days per week: Not on file    Minutes per session: Not on file  . Stress: Not on file  Relationships  . Social connections:    Talks on phone: Not on file    Gets together: Not on file    Attends religious service: Not on file    Active member of club or organization: Not on file    Attends meetings of clubs or organizations: Not on file    Relationship status: Not on file  . Intimate partner violence:    Fear of current or ex partner: Not on file    Emotionally abused: Not on file    Physically abused: Not on file    Forced sexual activity: Not on file  Other Topics Concern  . Not on file  Social History Narrative  . Not on file    Review of Systems:    Constitutional: No fever or chills Cardiovascular: No chest pain Respiratory: No SOB  Gastrointestinal: See HPI and otherwise negative   Physical Exam:  Vital signs: BP 100/62   Pulse 96   Ht 5' 5"   (1.651 m)   Wt 101 lb 8 oz (46 kg)   BMI 16.89 kg/m   Constitutional:   Pleasant ill appearing, pale Caucasian male appears to be in NAD, Well developed, Well nourished, alert and cooperative Respiratory: Respirations even and unlabored. Lungs clear to auscultation bilaterally.   No wheezes, crackles, or rhonchi.  Cardiovascular: Normal S1, S2. No MRG. Regular rate and rhythm. No peripheral edema, cyanosis or pallor.  Gastrointestinal:  Soft, nondistended, nontender. No rebound or guarding. Normal bowel sounds. No appreciable masses or hepatomegaly. Rectal:  Not performed.  Psychiatric: Demonstrates good judgement and reason without abnormal affect or behaviors.  RELEVANT LABS AND IMAGING: CBC    Component Value Date/Time   WBC 8.5 06/15/2018 1215   RBC 4.88 06/15/2018 1215   HGB 12.8 (L) 06/15/2018 1215   HGB 13.7 07/14/2017 1200   HCT 38.7 (L) 06/15/2018 1215   HCT 39.8 07/14/2017 1200   PLT 272.0 06/15/2018 1215   PLT 330 07/14/2017 1200   MCV 79.2 06/15/2018 1215   MCV 85 07/14/2017 1200   MCH 24.0 (L) 01/10/2018 1541   MCHC 33.1 06/15/2018 1215   RDW 16.4 (H) 06/15/2018 1215   RDW 13.9 07/14/2017 1200   LYMPHSABS 1.6 06/15/2018 1215   LYMPHSABS 1.5 07/14/2017 1200   MONOABS 1.3 (H) 06/15/2018 1215   EOSABS 1.7 (H) 06/15/2018 1215   EOSABS 0.1 07/14/2017 1200   BASOSABS 0.0 06/15/2018 1215   BASOSABS 0.0 07/14/2017 1200    CMP     Component Value Date/Time   NA 136 06/15/2018 1215   NA 132 (L) 07/14/2017 1200   K 4.7 06/15/2018 1215   CL 100 06/15/2018 1215   CO2 31 06/15/2018 1215   GLUCOSE 114 (H) 06/15/2018 1215   BUN 12 06/15/2018 1215   BUN 8 07/14/2017 1200   CREATININE 1.02 06/15/2018 1215   CALCIUM 9.7 06/15/2018 1215   PROT 7.3 06/15/2018 1215   PROT 6.9 07/14/2017 1200   ALBUMIN 3.8 06/15/2018  1215   ALBUMIN 3.9 07/14/2017 1200   AST 11 06/15/2018 1215   ALT 8 06/15/2018 1215   ALKPHOS 66 06/15/2018 1215   BILITOT 0.4 06/15/2018 1215   BILITOT  0.5 07/14/2017 1200   GFRNONAA >60 01/10/2018 1541   GFRAA >60 01/10/2018 1541    Assessment: 1.  Ulcerative colitis with flare: Colitis has never been fully under control, currently on Remicade, his next dose is due April 1 2.  C. difficile diarrhea: Initially diagnosed in January, completed Vancomycin and did better for about a week after his last Remicade injection, now with recurrent symptoms and repeat C. difficile testing 07/13/18 was positive, restarted on Vancomycin taper, currently on 4 times daily over the past week with no decrease in symptoms 3.  Weight loss: 45 pounds over the past year, about 10 pounds over the past few weeks  Plan: 1.  Discussed patient with Dr. Ardis Hughs at time of visit today.  He is failing outpatient therapy and recommendations are for him to be admitted. 2.  Did discuss case with Tye Savoy, NP who is covering [place] [facility] for our service.  Please call and let her know when patient arrives on the floor. 3.  Will need to be continued on Vancomycin, currently taking 4 times daily for his C. difficile 4.  Patient will need contact precautions 5.  Patient may also benefit from starting steroids while in the hospital, with close observation for sepsis or worsening symptoms.  Patient may also benefit from this flex sig at some point during hospitalization to better understand what is causing all of his symptoms. 6. Will also need updated labs including CBC/CMP 7.  Please await any further recommendations from our inpatient team.  Ellouise Newer, PA-C [facility] 07/26/2018, 9:30 AM  Cc: Dettinger, Fransisca Kaufmann, MD

## 2018-07-26 NOTE — Progress Notes (Signed)
Paged GI and called [place] office to try to reach NP or MD. No reply or orders from GI at this time. Pt is in the room.

## 2018-07-26 NOTE — Progress Notes (Signed)
Attending 69 Attestation   I have taken an interval history, reviewed the chart and examined the patient.   Please see separate note from [facility] [place] as well as PA [facility] for full details of HPI.  In brief, this is a patient with known ulcerative pancolitis who failed Entyvio and most recently has been on Remicade status post induction and his first dose after induction having been given earlier in February.  In January he was diagnosed with C. difficile for the first time.  He ended up getting Remicade and for 2 to 4 days felt slightly better with improvement in his bowel habits however slowly thereafter he had recurrence of his bowel habits.  He has between 4-8 bowel movements per day most of them were with blood however over the course of the weekend he had progressive symptoms once again.  In January he was treated with 125 4 times daily vancomycin p.o.  He completed 2-week course.  Even after completion of a 2-week course of vancomycin he was only slightly better before he underwent his Remicade infusion.  The patient was retested earlier this month and found to have a persistent C. difficile as well as a PCR being positive.  He was restarted on p.o. vancomycin with plan for a long taper.  For the last 12 days he has been on 4 times daily 125.  He is having worsening symptoms over the course of the weekend into this week.  He was seen in clinic today by PA Fabio Asa and case was discussed with his primary gastroenterologist Dr. Ardis Hughs.  Decision was made for him to come into the hospital for further management and evaluation.  After talking with the patient as well as his mother it seems that he is not responding either to C. difficile treatment or he may have underlying UC that remained in flare.  He is currently only on 5 mix per kick from what I can see in the records and he may be someone that may need to be on 10 mix per kick.  Unfortunately we cannot get trough levels as he just received his  dose 3-1/2 weeks ago.  I would not check for antibodies as of yet but on his next infusion/dose it may be reasonable for [place] to consider that.  I think he may benefit from the initiation of steroids if we increase his Vancomycin to 500 4 times daily and he is not having clinical improvement.  I think a flexible sigmoidoscopy with biopsies if there are not overt pseudomembranes will help guide therapy for [place] in case we need to initiate steroids.  We will send some laboratories as well as ova and parasite at that has not been drawn recently.  I discussed the increased risk of complications when the patient has C. difficile as well as a possible UC flare including increased risk of perforation and bleeding however I think it is the reasonable next step for [place].  I will obtain a KUB but if there is any change in his clinical exam then he may benefit from a cross-sectional CT abdomen/pelvis with IV contrast.  The risks and benefits of endoscopic evaluation were discussed with the patient; these include but are not limited to the risk of perforation, infection, bleeding, missed lesions, lack of diagnosis, severe illness requiring hospitalization, as well as anesthesia and sedation related illnesses.  The patient is agreeable to proceed tomorrow.  I agree with the Advanced Practitioner's note, impression, and recommendations with updates and my documentation  above.   Justice Britain, MD [facility] Advanced Endoscopy Office # [phone]

## 2018-07-26 NOTE — Telephone Encounter (Signed)
Called and spoke to [facility].  They figured out that pt has a bed (not in ICU).

## 2018-07-26 NOTE — Telephone Encounter (Signed)
Christian Cardenas  At [facility] called about the patient being addmiting  Today. Would like to know more info on the scheduling. [place] # [phone] . Needs to know if he needs to go to the ICU.

## 2018-07-26 NOTE — H&P (Signed)
[facility] History and Physical   Patient: Christian Cardenas TDV:[id number]   PCP: Dettinger, Fransisca Kaufmann, MD DOB: Apr 10, 1996   DOA: 07/26/2018   DOS: 07/26/2018   DOS: the patient was seen and examined on 07/26/2018  Patient coming from: The patient is coming from home  Chief Complaint: Persistent diarrhea  HPI: Christian Cardenas is a 23 y.o. male with Past medical history of ulcerative colitis with pancolitis, recurrent C. difficile infection.. Patient was seen in the clinic today due to persistent diarrhea. [place] on 07/12/2018 patient called GI office with reoccurrence of his diarrhea. C. difficile PCR was first performed which was positive. Patient was started on pulse dose oral vancomycin regimen. Despite taking this regimen faithfully at home patient continued to have severe diarrhea with poor p.o. intake as well as bright red blood per rectum. Also reports some abdominal pain and dizziness and lightheadedness. No fever no chills reported. No blood in the urine reported. Patient has significant weight loss over last 1 year. He is drinking Ensure but he is unable to keep any other food. Patient is on Remicade at home currently has undergone 4 doses.  ED Course: Directly admitted from clinic  At his baseline ambulates without any assistance, mother states that he is currently walking like a 23 year old. And is independent for most of his ADL; manages his medication on his own.  Review of Systems: as mentioned in the history of present illness.  All other systems reviewed and are negative.  Past Medical History:  Diagnosis Date  . Clostridioides difficile infection 05/2018  . Eczema   . Ulcerative colitis [facility])    Past Surgical History:  Procedure Laterality Date  . COLONOSCOPY W/ BIOPSIES    . MOUTH SURGERY     Social History:  reports that he has never smoked. He has never used smokeless tobacco. He reports that he does not drink alcohol or use drugs.  Allergies    Allergen Reactions  . Eggs Or Egg-Derived Products Anaphylaxis    AND MAYONNAISE Eczema reaction too  . Peanut Butter Flavor Anaphylaxis    ezcema issues also  . Peanut-Containing Drug Products Anaphylaxis    Family History  Problem Relation Age of Onset  . Seizures Mother   . Bipolar disorder Mother   . Hyperlipidemia Father   . Bipolar disorder Brother   . Anxiety disorder Brother   . Colon cancer Maternal Grandmother   . Stroke Maternal Grandmother   . Liver cancer Maternal Grandfather   . Esophageal cancer Neg Hx      Prior to Admission medications   Medication Sig Start Date End Date Taking? Authorizing Provider  diphenhydrAMINE (BENADRYL) 50 MG tablet Take 50 mg by mouth at bedtime as needed for itching.   Yes [provider]  fluticasone (FLONASE) 50 MCG/ACT nasal spray Place 1 spray into both nostrils 2 (two) times daily as needed for allergies or rhinitis. 08/30/17  Yes Dettinger, Fransisca Kaufmann, MD  inFLIXimab (REMICADE IV) Inject into the vein every 8 (eight) weeks.   Yes [provider]  Multiple Vitamin (MULTIVITAMIN WITH MINERALS) TABS tablet Take 1 tablet by mouth daily.   Yes [provider]  triamcinolone cream (KENALOG) 0.1 % Apply 1 application topically daily as needed (for irritation).  06/08/17  Yes [provider]  vancomycin (VANCOCIN HCL) 125 MG capsule Take 1 capsule (125 mg total) by mouth 4 (four) times daily for 14 days, THEN 1 capsule (125 mg total) 3 (three) times daily for 14 days,  THEN 1 capsule (125 mg total) 2 (two) times daily for 14 days, THEN 1 capsule (125 mg total) daily for 14 days, THEN 1 capsule (125 mg total) every other day for 14 days. 07/14/18 09/22/18 Yes Milus Banister, MD  betamethasone dipropionate (DIPROLENE) 0.05 % cream Apply topically 2 (two) times daily. 09/19/12   Hassell Done Mary-Margaret, FNP  predniSONE (DELTASONE) 20 MG tablet Take prednisone 53m daily for 3 weeks,then decrease by169mper  week Patient not taking: Reported on 07/26/2018 04/24/18   JaMilus BanisterMD    Physical Exam: Vitals:   07/26/18 1603  BP: 110/76  Pulse: 99  Resp: 18  Temp: 98.3 F (36.8 C)  TempSrc: Oral  SpO2: 99%    General: Alert, Awake and Oriented to Time, Place and Person. Appear in moderate distress, affect appropriate Eyes: PERRL, Conjunctiva normal ENT: Oral Mucosa clear dry. Neck: no JVD, no Abnormal Mass Or lumps Cardiovascular: S1 and S2 Present, no Murmur, Peripheral Pulses Present Respiratory: normal respiratory effort, Bilateral Air entry equal and Decreased, no use of accessory muscle, Clear to Auscultation, no Crackles, no wheezes Abdomen: Bowel Sound present, Soft and mild tenderness, no hernia Skin: no redness, no Rash, no induration Extremities: no Pedal edema, no calf tenderness Neurologic: Grossly no focal neuro deficit. Bilaterally Equal motor strength  Labs on Admission:  CBC: Recent Labs  Lab 07/26/18 1737  WBC 10.2  NEUTROABS 6.6  HGB 11.6*  HCT 39.4  MCV 83.7  PLT 43119  Basic Metabolic Panel: Recent Labs  Lab 07/26/18 1737  NA 134*  K 4.6  CL 99  CO2 27  GLUCOSE 94  BUN 21*  CREATININE 0.94  CALCIUM 8.6*  MG 2.1  PHOS 4.5   GFR: Estimated Creatinine Clearance: 78.2 mL/min (by C-G formula based on SCr of 0.94 mg/dL). Liver Function Tests: Recent Labs  Lab 07/26/18 1737  AST 15  ALT 11  ALKPHOS 60  BILITOT 0.2*  PROT 7.4  ALBUMIN 3.7   No results for input(s): LIPASE, AMYLASE in the last 168 hours. No results for input(s): AMMONIA in the last 168 hours. Coagulation Profile: No results for input(s): INR, PROTIME in the last 168 hours. Cardiac Enzymes: No results for input(s): CKTOTAL, CKMB, CKMBINDEX, TROPONINI in the last 168 hours. BNP (last 3 results) No results for input(s): PROBNP in the last 8760 hours. HbA1C: No results for input(s): HGBA1C in the last 72 hours. CBG: No results for input(s): GLUCAP in the last 168  hours. Lipid Profile: No results for input(s): CHOL, HDL, LDLCALC, TRIG, CHOLHDL, LDLDIRECT in the last 72 hours. Thyroid Function Tests: No results for input(s): TSH, T4TOTAL, FREET4, T3FREE, THYROIDAB in the last 72 hours. Anemia Panel: No results for input(s): VITAMINB12, FOLATE, FERRITIN, TIBC, IRON, RETICCTPCT in the last 72 hours. Urine analysis:    Component Value Date/Time   COLORURINE STRAW (A) 01/10/2018 1635   APPEARANCEUR CLEAR 01/10/2018 1635   LABSPEC 1.011 01/10/2018 1635   PHURINE 7.0 01/10/2018 1635   GLUCOSEU NEGATIVE 01/10/2018 1635   HGBUR NEGATIVE 01/10/2018 1635   BILIRUBINUR NEGATIVE 01/10/2018 1635   KETONESUR NEGATIVE 01/10/2018 1635   PROTEINUR NEGATIVE 01/10/2018 1635   NITRITE NEGATIVE 01/10/2018 1635   LEUKOCYTESUR NEGATIVE 01/10/2018 1635    Radiological Exams on Admission: Dg Abd 2 Views  Result Date: 07/26/2018 CLINICAL DATA:  Recurrent colitis.  Left side abdominal pain. EXAM: ABDOMEN - 2 VIEW COMPARISON:  CT 07/14/2017 FINDINGS: The bowel gas pattern is normal. There is no evidence of  free air. No radio-opaque calculi or other significant radiographic abnormality is seen. IMPRESSION: Negative. Electronically Signed   By: Rolm Baptise M.D.   On: 07/26/2018 18:31   Assessment/Plan 1. Recurrent Clostridium difficile diarrhea Patient presents with complaints of persistent diarrhea despite taking oral vancomycin at home. Also reports blood in the stool. Occasional nausea but no vomiting right now. No fever no chills. Report dizziness and severe weakness. GI is consulted. X-ray abdomen unremarkable. Labs are also unremarkable. Patient will be started on high-dose oral vancomycin 500 mg. We will monitor response. Patient also received IV fluid bolus. Continue with gentle IV hydration for now.  2.  Ulcerative pancolitis. Weight loss. Patient is on Remicade at home. Currently dealing with C. difficile colitis. Monitor recommendation of  GI. Appreciate their assistance in managing this patient. Flexible sigmoidoscopy tomorrow morning.  Nutrition: Regular diet, n.p.o. after midnight DVT Prophylaxis: mechanical compression device  Advance goals of care discussion: full code   Consults: gastroenterology   Family Communication: family was present at bedside, at the time of interview.  Opportunity was given to ask question and all questions were answered satisfactorily.  Disposition: Admitted as observation.  Initially was on empirically on telemetry, transition to [place] now. Likely to be discharged home, in 1-2 days.  Author: Berle Mull, MD [facility] 07/26/2018  To reach On-call, see care teams to locate the attending and reach out to them via [email or web address]. If 7PM-7AM, please contact night-coverage If you still have difficulty reaching the attending provider, please page the [facility] (Director on Call) for [facility] on amion for assistance.

## 2018-07-26 NOTE — Progress Notes (Signed)
Pt is in the room. Admitting hospitalist aware and coming to the floor. Paged GI with no response.

## 2018-07-26 NOTE — Patient Instructions (Addendum)
If you are age 25 or older, your body mass index should be between 23-30. Your Body mass index is 16.89 kg/m. If this is out of the aforementioned range listed, please consider follow up with your Primary Care Provider.  If you are age 75 or younger, your body mass index should be between 19-25. Your Body mass index is 16.89 kg/m. If this is out of the aformentioned range listed, please consider follow up with your Primary Care Provider.    Please wait on a call from our office in regards to hospitalization.   Thank you for entrusting me with your care and for choosing [employer], [place], [place]

## 2018-07-26 NOTE — Progress Notes (Signed)
I saw him in the office with Anderson Malta today.  He is doing terribly.  He looks dehydrated, cachectic, even more pale than usual.  He is having bloody bowel movements.  I think he would benefit from inpatient stay for hydration, lab evaluation, starting him on steroids (possibly oral if he will tolerate).  I am quite certain that he had C. difficile previously and he may still have it NOW.  The recent PCR positivity may be a false positive.  Either way I suspect given the bloody diarrhea that his colitis is flaring with or without ongoing C. difficile.  If he does not respond quickly to steroids, hydration, hospital stay then he might need flexible sigmoidoscopy as well.  I think keeping him on his tapering vanc regimen is safest for now.

## 2018-07-27 ENCOUNTER — Observation Stay (HOSPITAL_COMMUNITY): Payer: BLUE CROSS/BLUE SHIELD | Admitting: Anesthesiology

## 2018-07-27 ENCOUNTER — Encounter (HOSPITAL_COMMUNITY): Payer: Self-pay | Admitting: *Deleted

## 2018-07-27 ENCOUNTER — Encounter (HOSPITAL_COMMUNITY)
Admission: AD | Disposition: A | Discharge status: Home / Self Care | Payer: Self-pay | Source: Ambulatory Visit | Attending: Internal Medicine

## 2018-07-27 DIAGNOSIS — Z823 Family history of stroke: Secondary | ICD-10-CM | POA: Diagnosis not present

## 2018-07-27 DIAGNOSIS — Z91012 Allergy to eggs: Secondary | ICD-10-CM | POA: Diagnosis not present

## 2018-07-27 DIAGNOSIS — D638 Anemia in other chronic diseases classified elsewhere: Secondary | ICD-10-CM | POA: Diagnosis present

## 2018-07-27 DIAGNOSIS — Z8711 Personal history of peptic ulcer disease: Secondary | ICD-10-CM | POA: Diagnosis not present

## 2018-07-27 DIAGNOSIS — A0472 Enterocolitis due to Clostridium difficile, not specified as recurrent: Secondary | ICD-10-CM | POA: Diagnosis not present

## 2018-07-27 DIAGNOSIS — Z79899 Other long term (current) drug therapy: Secondary | ICD-10-CM | POA: Diagnosis not present

## 2018-07-27 DIAGNOSIS — Z800 Family history of malignant neoplasm of digestive organs: Secondary | ICD-10-CM | POA: Diagnosis not present

## 2018-07-27 DIAGNOSIS — L309 Dermatitis, unspecified: Secondary | ICD-10-CM | POA: Diagnosis present

## 2018-07-27 DIAGNOSIS — K921 Melena: Secondary | ICD-10-CM | POA: Diagnosis not present

## 2018-07-27 DIAGNOSIS — K5190 Ulcerative colitis, unspecified, without complications: Secondary | ICD-10-CM | POA: Diagnosis not present

## 2018-07-27 DIAGNOSIS — K529 Noninfective gastroenteritis and colitis, unspecified: Secondary | ICD-10-CM

## 2018-07-27 DIAGNOSIS — A0471 Enterocolitis due to Clostridium difficile, recurrent: Secondary | ICD-10-CM | POA: Diagnosis not present

## 2018-07-27 DIAGNOSIS — K625 Hemorrhage of anus and rectum: Secondary | ICD-10-CM | POA: Diagnosis not present

## 2018-07-27 DIAGNOSIS — K5100 Ulcerative (chronic) pancolitis without complications: Secondary | ICD-10-CM | POA: Diagnosis not present

## 2018-07-27 DIAGNOSIS — Z91010 Allergy to peanuts: Secondary | ICD-10-CM | POA: Diagnosis not present

## 2018-07-27 DIAGNOSIS — Z681 Body mass index (BMI) 19 or less, adult: Secondary | ICD-10-CM | POA: Diagnosis not present

## 2018-07-27 DIAGNOSIS — E43 Unspecified severe protein-calorie malnutrition: Secondary | ICD-10-CM | POA: Diagnosis present

## 2018-07-27 DIAGNOSIS — Z818 Family history of other mental and behavioral disorders: Secondary | ICD-10-CM | POA: Diagnosis not present

## 2018-07-27 DIAGNOSIS — Z7951 Long term (current) use of inhaled steroids: Secondary | ICD-10-CM | POA: Diagnosis not present

## 2018-07-27 DIAGNOSIS — Z7952 Long term (current) use of systemic steroids: Secondary | ICD-10-CM | POA: Diagnosis not present

## 2018-07-27 HISTORY — PX: BIOPSY: SHX5522

## 2018-07-27 HISTORY — PX: COLONOSCOPY WITH PROPOFOL: SHX5780

## 2018-07-27 LAB — COMPREHENSIVE METABOLIC PANEL
ALBUMIN: 2.7 g/dL — AB (ref 3.5–5.0)
ALT: 11 U/L (ref 0–44)
AST: 14 U/L — ABNORMAL LOW (ref 15–41)
Alkaline Phosphatase: 41 U/L (ref 38–126)
Anion gap: 5 (ref 5–15)
BUN: 13 mg/dL (ref 6–20)
CHLORIDE: 109 mmol/L (ref 98–111)
CO2: 23 mmol/L (ref 22–32)
Calcium: 7.8 mg/dL — ABNORMAL LOW (ref 8.9–10.3)
Creatinine, Ser: 0.77 mg/dL (ref 0.61–1.24)
GFR calc Af Amer: >60 mL/min (ref >60)
GFR calc non Af Amer: >60 mL/min (ref >60)
Glucose, Bld: 97 mg/dL (ref 70–99)
Potassium: 4.1 mmol/L (ref 3.5–5.1)
SODIUM: 137 mmol/L (ref 135–145)
Total Bilirubin: 0.5 mg/dL (ref 0.3–1.2)
Total Protein: 5.7 g/dL — ABNORMAL LOW (ref 6.5–8.1)

## 2018-07-27 LAB — CBC
HEMATOCRIT: 32.7 % — AB (ref 39.0–52.0)
Hemoglobin: 9.4 g/dL — ABNORMAL LOW (ref 13.0–17.0)
MCH: 24.5 pg — ABNORMAL LOW (ref 26.0–34.0)
MCHC: 28.7 g/dL — ABNORMAL LOW (ref 30.0–36.0)
MCV: 85.2 fL (ref 80.0–100.0)
Platelets: 299 10*3/uL (ref 150–400)
RBC: 3.84 MIL/uL — ABNORMAL LOW (ref 4.22–5.81)
RDW: 14.3 % (ref 11.5–15.5)
WBC: 9 10*3/uL (ref 4.0–10.5)
nRBC: 0 % (ref 0.0–0.2)

## 2018-07-27 LAB — HEPATITIS B CORE ANTIBODY, TOTAL: Hep B Core Total Ab: NEGATIVE

## 2018-07-27 SURGERY — BIOPSY
Anesthesia: Monitor Anesthesia Care

## 2018-07-27 MED ORDER — VANCOMYCIN 50 MG/ML ORAL SOLUTION: 125.0000 mg | Freq: Four times a day (QID) | ORAL | Status: DC

## 2018-07-27 MED ORDER — PROPOFOL 10 MG/ML IV BOLUS
INTRAVENOUS | Status: DC | PRN
  Administered 2018-07-27 (×3): 20 mg via INTRAVENOUS
  Administered 2018-07-27: 30 mg via INTRAVENOUS
  Administered 2018-07-27 (×6): 20 mg via INTRAVENOUS

## 2018-07-27 MED ORDER — SACCHAROMYCES BOULARDII 250 MG PO CAPS
250.0000 mg | ORAL_CAPSULE | Freq: Two times a day (BID) | ORAL | Status: DC
  Administered 2018-07-27 – 2018-07-30 (×6): 250 mg via ORAL
  Filled 2018-07-27 (×6): qty 1

## 2018-07-27 MED ORDER — VANCOMYCIN 50 MG/ML ORAL SOLUTION
125.0000 mg | Freq: Four times a day (QID) | ORAL | Status: DC
  Administered 2018-07-27 – 2018-07-30 (×12): 125 mg via ORAL
  Filled 2018-07-27 (×14): qty 2.5

## 2018-07-27 MED ORDER — PROPOFOL 10 MG/ML IV BOLUS
INTRAVENOUS | Status: AC
  Filled 2018-07-27: qty 60

## 2018-07-27 MED ORDER — METHYLPREDNISOLONE SODIUM SUCC 40 MG IJ SOLR
20.0000 mg | Freq: Three times a day (TID) | INTRAMUSCULAR | Status: DC
  Administered 2018-07-27 – 2018-07-30 (×9): 20 mg via INTRAVENOUS
  Filled 2018-07-27 (×9): qty 1

## 2018-07-27 MED ORDER — LIDOCAINE 2% (20 MG/ML) 5 ML SYRINGE
INTRAMUSCULAR | Status: DC | PRN
  Administered 2018-07-27: 40 mg via INTRAVENOUS

## 2018-07-27 NOTE — Interval H&P Note (Signed)
History and Physical Interval Note:  07/27/2018 11:44 AM  Christian Cardenas  has presented today for surgery, with the diagnosis of diarrhea, ulcerative colitis  The various methods of treatment have been discussed with the patient and family. After consideration of risks, benefits and other options for treatment, the patient has consented to  Procedure(s): FLEXIBLE SIGMOIDOSCOPY (N/A) as a surgical intervention .  The patient's history has been reviewed, patient examined, no change in status, stable for surgery.  I have reviewed the patient's chart and labs.  Questions were answered to the patient's satisfaction.     Milus Banister

## 2018-07-27 NOTE — H&P (View-Only) (Signed)
PROGRESS NOTE    Christian Cardenas  NKN:[id number] DOB: Oct 14, 1995 DOA: 07/26/2018 PCP: Dettinger, Fransisca Kaufmann, MD  Brief Narrative:23 y.o. male with Past medical history of ulcerative colitis with pancolitis, recurrent C. difficile infection.. Patient was seen in the clinic today due to persistent diarrhea. [place] on 07/12/2018 patient called GI office with reoccurrence of his diarrhea. C. difficile PCR was first performed which was positive. Patient was started on pulse dose oral vancomycin regimen. Despite taking this regimen faithfully at home patient continued to have severe diarrhea with poor p.o. intake as well as bright red blood per rectum. Also reports some abdominal pain and dizziness and lightheadedness. No fever no chills reported. No blood in the urine reported. Patient has significant weight loss over last 1 year. He is drinking Ensure but he is unable to keep any other food. Patient is on Remicade at home currently has undergone 4 doses.  ED Course: Directly admitted from clinic  At his baseline ambulates without any assistance, mother states that he is currently walking like a 23 year old. And is independent for most of his ADL; manages his medication on his own.   Assessment & Plan:   Principal Problem:   Recurrent Clostridium difficile diarrhea Active Problems:   Ulcerative pancolitis without complication (HCC)   Weight loss   BRBPR (bright red blood per rectum)   1. Recurrent Clostridium difficile diarrhea Patient presents with complaints of persistent diarrhea despite taking oral vancomycin at home. Also reports blood in the stool. Occasional nausea but no vomiting right now. No fever no chills. Report dizziness and severe weakness. GI is consult noted patient to have flexible sigmoidoscopy today. X-ray abdomen unremarkable. Labs are also unremarkable. Patient will be started on high-dose oral vancomycin 500 mg. We will monitor response. Patient also  received IV fluid bolus. Continue with gentle IV hydration for now. Add probiotics  2.  Ulcerative pancolitis. Weight loss. Patient is on Remicade at home. Currently dealing with C. difficile colitis. Monitor recommendation of GI. Appreciate their assistance in managing this patient. Flexible sigmoidoscopy today  Nutrition: Regular diet, n.p.o. after midnight DVT Prophylaxis: mechanical compression device     Estimated body mass index is 16.89 kg/m as calculated from the following:   Height as of an earlier encounter on 07/26/18: 5' 5"  (1.651 m).   Weight as of an earlier encounter on 07/26/18: 46 kg.  DVT prophylaxis: SCD  code Status: Full code Family Communication: None available at bedside Disposition Plan: Pending clinical improvement  Consultants:   GI  Procedures: None Antimicrobials:   Subjective: Sitting in bed had multiple episodes of loose bowel movements overnight no vomiting continues to have abdominal pain  Objective: Vitals:   07/26/18 1603 07/26/18 2046 07/27/18 0447  BP: 110/76 (!) 90/55 111/64  Pulse: 99 100 (!) 101  Resp: 18 16 18   Temp: 98.3 F (36.8 C) 98.2 F (36.8 C) 98.9 F (37.2 C)  TempSrc: Oral Oral Oral  SpO2: 99% 98% 97%    Intake/Output Summary (Last 24 hours) at 07/27/2018 3790 Last data filed at 07/27/2018 0600 Gross per 24 hour  Intake 1521.87 ml  Output -  Net 1521.87 ml   There were no vitals filed for this visit.  Examination:  General exam: Appears calm and comfortable  Respiratory system: Clear to auscultation. Respiratory effort normal. Cardiovascular system: S1 & S2 heard, RRR. No JVD, murmurs, rubs, gallops or clicks. No pedal edema. Gastrointestinal system: Abdomen is nondistended, soft and tender. No organomegaly or masses felt. Normal bowel  sounds heard. Central nervous system: Alert and oriented. No focal neurological deficits. Extremities: Symmetric 5 x 5 power. Skin: No rashes, lesions or  ulcers Psychiatry: Judgement and insight appear normal. Mood & affect appropriate.     Data Reviewed: I have personally reviewed following labs and imaging studies  CBC: Recent Labs  Lab 07/26/18 1737 07/27/18 0543  WBC 10.2 9.0  NEUTROABS 6.6  --   HGB 11.6* 9.4*  HCT 39.4 32.7*  MCV 83.7 85.2  PLT 437* 128   Basic Metabolic Panel: Recent Labs  Lab 07/26/18 1737 07/27/18 0543  NA 134* 137  K 4.6 4.1  CL 99 109  CO2 27 23  GLUCOSE 94 97  BUN 21* 13  CREATININE 0.94 0.77  CALCIUM 8.6* 7.8*  MG 2.1  --   PHOS 4.5  --    GFR: Estimated Creatinine Clearance: 91.8 mL/min (by C-G formula based on SCr of 0.77 mg/dL). Liver Function Tests: Recent Labs  Lab 07/26/18 1737 07/27/18 0543  AST 15 14*  ALT 11 11  ALKPHOS 60 41  BILITOT 0.2* 0.5  PROT 7.4 5.7*  ALBUMIN 3.7 2.7*   No results for input(s): LIPASE, AMYLASE in the last 168 hours. No results for input(s): AMMONIA in the last 168 hours. Coagulation Profile: No results for input(s): INR, PROTIME in the last 168 hours. Cardiac Enzymes: No results for input(s): CKTOTAL, CKMB, CKMBINDEX, TROPONINI in the last 168 hours. BNP (last 3 results) No results for input(s): PROBNP in the last 8760 hours. HbA1C: No results for input(s): HGBA1C in the last 72 hours. CBG: No results for input(s): GLUCAP in the last 168 hours. Lipid Profile: No results for input(s): CHOL, HDL, LDLCALC, TRIG, CHOLHDL, LDLDIRECT in the last 72 hours. Thyroid Function Tests: No results for input(s): TSH, T4TOTAL, FREET4, T3FREE, THYROIDAB in the last 72 hours. Anemia Panel: No results for input(s): VITAMINB12, FOLATE, FERRITIN, TIBC, IRON, RETICCTPCT in the last 72 hours. Sepsis Labs: No results for input(s): PROCALCITON, LATICACIDVEN in the last 168 hours.  No results found for this or any previous visit (from the past 240 hour(s)).       Radiology Studies: Dg Abd 2 Views  Result Date: 07/26/2018 CLINICAL DATA:  Recurrent  colitis.  Left side abdominal pain. EXAM: ABDOMEN - 2 VIEW COMPARISON:  CT 07/14/2017 FINDINGS: The bowel gas pattern is normal. There is no evidence of free air. No radio-opaque calculi or other significant radiographic abnormality is seen. IMPRESSION: Negative. Electronically Signed   By: Rolm Baptise M.D.   On: 07/26/2018 18:31        Scheduled Meds: . feeding supplement (ENSURE ENLIVE)  237 mL Oral BID BM  . vancomycin  500 mg Oral QID   Continuous Infusions: . sodium chloride 150 mL/hr at 07/27/18 0251     LOS: 0 days     Georgette Shell, MD [facility]  If 7PM-7AM, please contact night-coverage [email or web address] Password [email or web address] 07/27/2018, 9:21 AM

## 2018-07-27 NOTE — Anesthesia Preprocedure Evaluation (Signed)
Anesthesia Evaluation  Patient identified by MRN, date of birth, ID band Patient awake    Reviewed: Allergy & Precautions, NPO status , Patient's Chart, lab work & pertinent test results  Airway Mallampati: II  TM Distance: >3 FB Neck ROM: Full    Dental  (+) Dental Advisory Given   Pulmonary neg pulmonary ROS,    breath sounds clear to auscultation       Cardiovascular negative cardio ROS   Rhythm:Regular Rate:Normal     Neuro/Psych negative neurological ROS  negative psych ROS   GI/Hepatic Neg liver ROS, PUD, Ulcerative colitis   Endo/Other  negative endocrine ROS  Renal/GU negative Renal ROS  negative genitourinary   Musculoskeletal negative musculoskeletal ROS (+)   Abdominal   Peds negative pediatric ROS (+)  Hematology  (+) anemia ,   Anesthesia Other Findings   Reproductive/Obstetrics negative OB ROS                             Lab Results  Component Value Date   WBC 9.0 07/27/2018   HGB 9.4 (L) 07/27/2018   HCT 32.7 (L) 07/27/2018   MCV 85.2 07/27/2018   PLT 299 07/27/2018   Lab Results  Component Value Date   CREATININE 0.77 07/27/2018   BUN 13 07/27/2018   NA 137 07/27/2018   K 4.1 07/27/2018   CL 109 07/27/2018   CO2 23 07/27/2018    Anesthesia Physical Anesthesia Plan  ASA: III  Anesthesia Plan: MAC   Post-op Pain Management:    Induction: Intravenous  PONV Risk Score and Plan: 1 and Propofol infusion, Treatment may vary due to age or medical condition and Ondansetron  Airway Management Planned: Natural Airway and Simple Face Mask  Additional Equipment:   Intra-op Plan:   Post-operative Plan:   Informed Consent: I have reviewed the patients History and Physical, chart, labs and discussed the procedure including the risks, benefits and alternatives for the proposed anesthesia with the patient or authorized representative who has indicated his/her  understanding and acceptance.       Plan Discussed with: CRNA  Anesthesia Plan Comments:         Anesthesia Quick Evaluation

## 2018-07-27 NOTE — Progress Notes (Signed)
PROGRESS NOTE    Christian Cardenas  ZOX:[id number] DOB: 11/10/1995 DOA: 07/26/2018 PCP: Dettinger, Fransisca Kaufmann, MD  Brief Narrative:23 y.o. male with Past medical history of ulcerative colitis with pancolitis, recurrent C. difficile infection.. Patient was seen in the clinic today due to persistent diarrhea. Shiley on 07/12/2018 patient called GI office with reoccurrence of his diarrhea. C. difficile PCR was first performed which was positive. Patient was started on pulse dose oral vancomycin regimen. Despite taking this regimen faithfully at home patient continued to have severe diarrhea with poor p.o. intake as well as bright red blood per rectum. Also reports some abdominal pain and dizziness and lightheadedness. No fever no chills reported. No blood in the urine reported. Patient has significant weight loss over last 1 year. He is drinking Ensure but he is unable to keep any other food. Patient is on Remicade at home currently has undergone 4 doses.  ED Course: Directly admitted from clinic  At his baseline ambulates without any assistance, mother states that he is currently walking like a 25 year old. And is independent for most of his ADL; manages his medication on his own.   Assessment & Plan:   Principal Problem:   Recurrent Clostridium difficile diarrhea Active Problems:   Ulcerative pancolitis without complication (HCC)   Weight loss   BRBPR (bright red blood per rectum)   1. Recurrent Clostridium difficile diarrhea Patient presents with complaints of persistent diarrhea despite taking oral vancomycin at home. Also reports blood in the stool. Occasional nausea but no vomiting right now. No fever no chills. Report dizziness and severe weakness. GI is consult noted patient to have flexible sigmoidoscopy today. X-ray abdomen unremarkable. Labs are also unremarkable. Patient will be started on high-dose oral vancomycin 500 mg. We will monitor response. Patient also  received IV fluid bolus. Continue with gentle IV hydration for now. Add probiotics  2.  Ulcerative pancolitis. Weight loss. Patient is on Remicade at home. Currently dealing with C. difficile colitis. Monitor recommendation of GI. Appreciate their assistance in managing this patient. Flexible sigmoidoscopy today  Nutrition: Regular diet, n.p.o. after midnight DVT Prophylaxis: mechanical compression device     Estimated body mass index is 16.89 kg/m as calculated from the following:   Height as of an earlier encounter on 07/26/18: 5' 5"  (1.651 m).   Weight as of an earlier encounter on 07/26/18: 46 kg.  DVT prophylaxis: SCD  code Status: Full code Family Communication: None available at bedside Disposition Plan: Pending clinical improvement  Consultants:   GI  Procedures: None Antimicrobials:   Subjective: Sitting in bed had multiple episodes of loose bowel movements overnight no vomiting continues to have abdominal pain  Objective: Vitals:   07/26/18 1603 07/26/18 2046 07/27/18 0447  BP: 110/76 (!) 90/55 111/64  Pulse: 99 100 (!) 101  Resp: 18 16 18   Temp: 98.3 F (36.8 C) 98.2 F (36.8 C) 98.9 F (37.2 C)  TempSrc: Oral Oral Oral  SpO2: 99% 98% 97%    Intake/Output Summary (Last 24 hours) at 07/27/2018 8119 Last data filed at 07/27/2018 0600 Gross per 24 hour  Intake 1521.87 ml  Output -  Net 1521.87 ml   There were no vitals filed for this visit.  Examination:  General exam: Appears calm and comfortable  Respiratory system: Clear to auscultation. Respiratory effort normal. Cardiovascular system: S1 & S2 heard, RRR. No JVD, murmurs, rubs, gallops or clicks. No pedal edema. Gastrointestinal system: Abdomen is nondistended, soft and tender. No organomegaly or masses felt. Normal bowel  sounds heard. Central nervous system: Alert and oriented. No focal neurological deficits. Extremities: Symmetric 5 x 5 power. Skin: No rashes, lesions or  ulcers Psychiatry: Judgement and insight appear normal. Mood & affect appropriate.     Data Reviewed: I have personally reviewed following labs and imaging studies  CBC: Recent Labs  Lab 07/26/18 1737 07/27/18 0543  WBC 10.2 9.0  NEUTROABS 6.6  --   HGB 11.6* 9.4*  HCT 39.4 32.7*  MCV 83.7 85.2  PLT 437* 161   Basic Metabolic Panel: Recent Labs  Lab 07/26/18 1737 07/27/18 0543  NA 134* 137  K 4.6 4.1  CL 99 109  CO2 27 23  GLUCOSE 94 97  BUN 21* 13  CREATININE 0.94 0.77  CALCIUM 8.6* 7.8*  MG 2.1  --   PHOS 4.5  --    GFR: Estimated Creatinine Clearance: 91.8 mL/min (by C-G formula based on SCr of 0.77 mg/dL). Liver Function Tests: Recent Labs  Lab 07/26/18 1737 07/27/18 0543  AST 15 14*  ALT 11 11  ALKPHOS 60 41  BILITOT 0.2* 0.5  PROT 7.4 5.7*  ALBUMIN 3.7 2.7*   No results for input(s): LIPASE, AMYLASE in the last 168 hours. No results for input(s): AMMONIA in the last 168 hours. Coagulation Profile: No results for input(s): INR, PROTIME in the last 168 hours. Cardiac Enzymes: No results for input(s): CKTOTAL, CKMB, CKMBINDEX, TROPONINI in the last 168 hours. BNP (last 3 results) No results for input(s): PROBNP in the last 8760 hours. HbA1C: No results for input(s): HGBA1C in the last 72 hours. CBG: No results for input(s): GLUCAP in the last 168 hours. Lipid Profile: No results for input(s): CHOL, HDL, LDLCALC, TRIG, CHOLHDL, LDLDIRECT in the last 72 hours. Thyroid Function Tests: No results for input(s): TSH, T4TOTAL, FREET4, T3FREE, THYROIDAB in the last 72 hours. Anemia Panel: No results for input(s): VITAMINB12, FOLATE, FERRITIN, TIBC, IRON, RETICCTPCT in the last 72 hours. Sepsis Labs: No results for input(s): PROCALCITON, LATICACIDVEN in the last 168 hours.  No results found for this or any previous visit (from the past 240 hour(s)).       Radiology Studies: Dg Abd 2 Views  Result Date: 07/26/2018 CLINICAL DATA:  Recurrent  colitis.  Left side abdominal pain. EXAM: ABDOMEN - 2 VIEW COMPARISON:  CT 07/14/2017 FINDINGS: The bowel gas pattern is normal. There is no evidence of free air. No radio-opaque calculi or other significant radiographic abnormality is seen. IMPRESSION: Negative. Electronically Signed   By: Rolm Baptise M.D.   On: 07/26/2018 18:31        Scheduled Meds: . feeding supplement (ENSURE ENLIVE)  237 mL Oral BID BM  . vancomycin  500 mg Oral QID   Continuous Infusions: . sodium chloride 150 mL/hr at 07/27/18 0251     LOS: 0 days     Georgette Shell, MD [facility]  If 7PM-7AM, please contact night-coverage [email or web address] Password [facility] 07/27/2018, 9:21 AM

## 2018-07-27 NOTE — Op Note (Signed)
[facility] Patient Name: Christian Cardenas Procedure Date: 07/27/2018 MRN: [id number] Attending MD: Milus Banister , MD Date of Birth: Aug 04, 1995 CSN: [id number] Age: 23 Admit Type: Inpatient Procedure:                Colonoscopy Indications:              Extensive ulcerative colitis, recent C. diff +                            diarrhea (brother also had this), clinically                            responded to vanc but recurred, now with severe                            colitis symptoms, current in patient. Providers:                Milus Banister, MD, Cleda Daub, RN, Cletis Athens, Technician, Marla Roe, CRNA Referring MD:              Medicines:                Monitored Anesthesia Care Complications:            No immediate complications. Estimated blood loss:                            None. Estimated Blood Loss:     Estimated blood loss: none. Procedure:                Pre-Anesthesia Assessment:                           - Prior to the procedure, a History and Physical                            was performed, and patient medications and                            allergies were reviewed. The patient's tolerance of                            previous anesthesia was also reviewed. The risks                            and benefits of the procedure and the sedation                            options and risks were discussed with the patient.                            All questions were answered, and informed consent  was obtained. Prior Anticoagulants: The patient has                            taken no previous anticoagulant or antiplatelet                            agents. ASA Grade Assessment: III - A patient with                            severe systemic disease. After reviewing the risks                            and benefits, the patient was deemed in                            satisfactory  condition to undergo the procedure.                           After obtaining informed consent, the colonoscope                            was passed under direct vision. Throughout the                            procedure, the patient's blood pressure, pulse, and                            oxygen saturations were monitored continuously. The                            PCF-H190DL ([id number] Olympus pediatric colonscope                            was introduced through the anus and advanced to the                            the terminal ileum. The colonoscopy was performed                            without difficulty. The patient tolerated the                            procedure well. The quality of the bowel                            preparation was good. The terminal ileum, ileocecal                            valve, appendiceal orifice, and rectum were                            photographed. Scope In: 12:50:14 PM Scope Out: 1:08:38 PM Scope Withdrawal Time: 0 hours 13 minutes 6 seconds  Total Procedure Duration: 0 hours 18 minutes  24 seconds  Findings:      The terminal ileum appeared normal.      There was severe inflammation throughout the colon; friable,       erythematous, edematous, intermittently ulcerated mucosa from anus to       cecum. The recto sigmoid was the segment with most impressive,       circumferential ulcers. There were no overt pseudomembranes present but       intermittent mucopurulence throughout.      There was a solitary, deeply cratered ulcer in the cecum with unusual       outer borders, measuring 1-2cm across. This was biospsied extensively       (jar 1).      The severely inflamed colon mucoca was randomly biospied (jar 2).      The most severely ulcerated rectosigmoid segment was biopsied and sent       for viral testing (jar 3).      The exam was otherwise without abnormality. Impression:               - Normal TI.                           -  Severely inflamed colon from anus to cecum (see                            above). Biopsied extensively, some of the samples                            were sent for viral testing.                           - No obvious pseudomembranes. Moderate Sedation:      Not Applicable - Patient had care per Anesthesia. Recommendation:           - Given lack of obvious pseudomembranes I think it                            will be safe to decrease oral vancomycin back to                            his previous tapering regimen (will start with                            192m orally q 6 hours).                           - Will start IV steroids (solumedrol 287mIV Q                            8hours) and observe him clinically.                           - Encourage PO intake. Procedure Code(s):        --- Professional ---                           45581 [id number], flexible; with biopsy,  single                            or multiple Diagnosis Code(s):        --- Professional ---                           K52.9, Noninfective gastroenteritis and colitis,                            unspecified                           K92.1, Melena (includes Hematochezia) CPT copyright 2018 [employer]. All rights reserved. The codes documented in this report are preliminary and upon coder review may  be revised to meet current compliance requirements. Milus Banister, MD 07/27/2018 1:23:21 PM This report has been signed electronically. Number of Addenda: 0

## 2018-07-27 NOTE — Anesthesia Postprocedure Evaluation (Signed)
Anesthesia Post Note  Patient: Christian Cardenas  Procedure(s) Performed: FLEXIBLE SIGMOIDOSCOPY (N/A ) BIOPSY COLONOSCOPY WITH PROPOFOL (N/A )     Patient location during evaluation: PACU Anesthesia Type: MAC Level of consciousness: awake and alert Pain management: pain level controlled Vital Signs Assessment: post-procedure vital signs reviewed and stable Respiratory status: spontaneous breathing, nonlabored ventilation, respiratory function stable and patient connected to nasal cannula oxygen Cardiovascular status: stable and blood pressure returned to baseline Postop Assessment: no apparent nausea or vomiting Anesthetic complications: no    Last Vitals:  Vitals:   07/27/18 1343 07/27/18 1350  BP: (!) 88/51 (!) 103/58  Pulse: 93 89  Resp: 17 16  Temp:    SpO2: 97% 100%    Last Pain:  Vitals:   07/27/18 1350  TempSrc:   PainSc: 0-No pain                 Tiajuana Amass

## 2018-07-27 NOTE — Transfer of Care (Signed)
Immediate Anesthesia Transfer of Care Note  Patient: Christian Cardenas  Procedure(s) Performed: FLEXIBLE SIGMOIDOSCOPY (N/A ) BIOPSY  Patient Location: PACU  Anesthesia Type:MAC  Level of Consciousness: sedated  Airway & Oxygen Therapy: Patient Spontanous Breathing and Patient connected to face mask oxygen  Post-op Assessment: Report given to RN and Post -op Vital signs reviewed and stable  Post vital signs: Reviewed and stable  Last Vitals:  Vitals Value Taken Time  BP    Temp    Pulse    Resp    SpO2      Last Pain:  Vitals:   07/27/18 1115  TempSrc: Oral  PainSc: 0-No pain      Patients Stated Pain Goal: 2 (56/86/16 8372)  Complications: No apparent anesthesia complications

## 2018-07-27 NOTE — Anesthesia Procedure Notes (Signed)
Date/Time: 07/27/2018 12:42 PM Performed by: Talbot Grumbling, CRNA Oxygen Delivery Method: Simple face mask

## 2018-07-28 ENCOUNTER — Encounter (HOSPITAL_COMMUNITY): Payer: Self-pay | Admitting: Gastroenterology

## 2018-07-28 DIAGNOSIS — A0472 Enterocolitis due to Clostridium difficile, not specified as recurrent: Secondary | ICD-10-CM

## 2018-07-28 DIAGNOSIS — K5100 Ulcerative (chronic) pancolitis without complications: Secondary | ICD-10-CM

## 2018-07-28 DIAGNOSIS — E43 Unspecified severe protein-calorie malnutrition: Secondary | ICD-10-CM

## 2018-07-28 LAB — CBC
HCT: 31.8 % — ABNORMAL LOW (ref 39.0–52.0)
Hemoglobin: 9.2 g/dL — ABNORMAL LOW (ref 13.0–17.0)
MCH: 24.6 pg — AB (ref 26.0–34.0)
MCHC: 28.9 g/dL — ABNORMAL LOW (ref 30.0–36.0)
MCV: 85 fL (ref 80.0–100.0)
Platelets: 290 10*3/uL (ref 150–400)
RBC: 3.74 MIL/uL — ABNORMAL LOW (ref 4.22–5.81)
RDW: 14 % (ref 11.5–15.5)
WBC: 4.9 10*3/uL (ref 4.0–10.5)
nRBC: 0 % (ref 0.0–0.2)

## 2018-07-28 LAB — COMPREHENSIVE METABOLIC PANEL
ALT: 11 U/L (ref 0–44)
ANION GAP: 5 (ref 5–15)
AST: 12 U/L — ABNORMAL LOW (ref 15–41)
Albumin: 2.8 g/dL — ABNORMAL LOW (ref 3.5–5.0)
Alkaline Phosphatase: 43 U/L (ref 38–126)
BUN: 8 mg/dL (ref 6–20)
CO2: 23 mmol/L (ref 22–32)
Calcium: 8.2 mg/dL — ABNORMAL LOW (ref 8.9–10.3)
Chloride: 109 mmol/L (ref 98–111)
Creatinine, Ser: 0.59 mg/dL — ABNORMAL LOW (ref 0.61–1.24)
GFR calc Af Amer: >60 mL/min (ref >60)
GFR calc non Af Amer: >60 mL/min (ref >60)
Glucose, Bld: 122 mg/dL — ABNORMAL HIGH (ref 70–99)
Potassium: 3.9 mmol/L (ref 3.5–5.1)
Sodium: 137 mmol/L (ref 135–145)
Total Bilirubin: 0.3 mg/dL (ref 0.3–1.2)
Total Protein: 6.1 g/dL — ABNORMAL LOW (ref 6.5–8.1)

## 2018-07-28 LAB — CALPROTECTIN, FECAL: Calprotectin, Fecal: 295 ug/g — ABNORMAL HIGH (ref 0–120)

## 2018-07-28 MED ORDER — ADULT MULTIVITAMIN W/MINERALS CH
1.0000 | ORAL_TABLET | Freq: Every day | ORAL | Status: DC
  Administered 2018-07-28 – 2018-07-30 (×3): 1 via ORAL
  Filled 2018-07-28 (×3): qty 1

## 2018-07-28 MED ORDER — TRAZODONE HCL 50 MG PO TABS
50.0000 mg | ORAL_TABLET | Freq: Every day | ORAL | Status: DC
  Administered 2018-07-28 – 2018-07-29 (×2): 50 mg via ORAL
  Filled 2018-07-28 (×2): qty 1

## 2018-07-28 NOTE — Progress Notes (Addendum)
[facility] Progress Note   Chief Complaint:   Ulcerative colitis    SUBJECTIVE:    diarrhea slightly improved. Eating breakfast. No specific complaints   ASSESSMENT AND PLAN:    23 yo male with UC and recent C-diff infection. Colonoscopy yesterday suggests that ongoing diarrhea more related to severe UC than C-diff coliits.  -continue Vancomycin, dose reduced back to 125 mg QID -Continue Florastor -Continue IV steroids.  -monitor electrolytes (normal today).   Hypoalbuminemia, likely malnutrition and acute phase reaction  OBJECTIVE:     Vital signs in last 24 hours: Temp:  [97.8 F (36.6 C)-98.8 F (37.1 C)] 97.8 F (36.6 C) (02/28 0533) Pulse Rate:  [83-101] 83 (02/28 0533) Resp:  [14-22] 16 (02/28 0533) BP: (77-122)/(37-77) 122/77 (02/28 0533) SpO2:  [94 %-100 %] 99 % (02/28 0533) Weight:  [46 kg-50.1 kg] 50.1 kg (02/28 0533) Last BM Date: 07/27/18 General:   Alert, thin male in NAD EENT:  Normal hearing, non icteric sclera, conjunctive pink.  Heart:  Regular rate and rhythm; no murmur.  No lower extremity edema   Pulm: Normal respiratory effort, lungs CTA bilaterally without wheezes or crackles. Abdomen:  Soft, nondistended, mild diffuse tenderness.  Normal bowel sounds, no masses felt.       Neurologic:  Alert and  oriented x4;  grossly normal neurologically. Psych:  Pleasant, cooperative.  Normal mood and affect.   Intake/Output from previous day: 02/27 0701 - 02/28 0700 In: 2883.5 [I.V.:2883.5] Out: 500 [Urine:500] Intake/Output this shift: No intake/output data recorded.  Lab Results: Recent Labs    07/26/18 1737 07/27/18 0543 07/28/18 0541  WBC 10.2 9.0 4.9  HGB 11.6* 9.4* 9.2*  HCT 39.4 32.7* 31.8*  PLT 437* 299 290   BMET Recent Labs    07/26/18 1737 07/27/18 0543 07/28/18 0541  NA 134* 137 137  K 4.6 4.1 3.9  CL 99 109 109  CO2 27 23 23   GLUCOSE 94 97 122*  BUN 21* 13 8  CREATININE 0.94 0.77 0.59*  CALCIUM  8.6* 7.8* 8.2*   LFT Recent Labs    07/28/18 0541  PROT 6.1*  ALBUMIN 2.8*  AST 12*  ALT 11  ALKPHOS 43  BILITOT 0.3   PT/INR No results for input(s): LABPROT, INR in the last 72 hours. Hepatitis Panel No results for input(s): HEPBSAG, HCVAB, HEPAIGM, HEPBIGM in the last 72 hours.  Dg Abd 2 Views  Result Date: 07/26/2018 CLINICAL DATA:  Recurrent colitis.  Left side abdominal pain. EXAM: ABDOMEN - 2 VIEW COMPARISON:  CT 07/14/2017 FINDINGS: The bowel gas pattern is normal. There is no evidence of free air. No radio-opaque calculi or other significant radiographic abnormality is seen. IMPRESSION: Negative. Electronically Signed   By: Rolm Baptise M.D.   On: 07/26/2018 18:31     Principal Problem:   Recurrent Clostridium difficile diarrhea Active Problems:   Ulcerative pancolitis without complication (HCC)   Weight loss   BRBPR (bright red blood per rectum)   Colitis   C. difficile colitis     LOS: 1 day   Tye Savoy ,NP 07/28/2018, 9:08 AM   I have discussed the case with the PA, and that is the plan I formulated. I personally interviewed and examined the patient.  I reviewed the inpatient consult note and colonoscopy report from yesterday. He is pale, fatigued, mild lower abdominal tenderness without guarding.  This patient is mildly improved, with decreased abdominal pain and diarrhea.  He is only received a couple  doses of steroids so far.  Colonoscopy confirms very active pancolitis with larger ulcers that were biopsied to rule out CMV.  It is felt that his C. difficile infection is a lesser component then the active colitis, which is why steroids were started and vancomycin dose decreased.  He is tolerating a regular diet, albeit not as much as he would normally eat.  He has anemia of acute on chronic illness.  He is nontoxic and appears comfortable.  We will continue current course of IV steroids, converting to oral prior to discharge.  Continue full course of  vancomycin at this dose.  Diet as tolerated.  Weekend physician will follow this patient.  I am hopeful he will be well enough to go home later in the weekend or early next week.  Total time 25 minutes, over half spent in direct face-to-face contact with patient summarizing care thus far as well as treatment plan.   Nelida Meuse III Office: [phone]

## 2018-07-28 NOTE — Progress Notes (Signed)
PROGRESS NOTE    Christian Cardenas  YWV:[id number] DOB: [birth year] DOA: 07/26/2018 PCP: Dettinger, Fransisca Kaufmann, MD   Brief Narrative: 23 y.o.malewith Past medical history ofulcerative colitis with pancolitis, recurrent C. difficile infection.. Patient was seen in the clinic today due to persistent diarrhea. Shiley on 07/12/2018 patient called GI office with reoccurrence of his diarrhea. C. difficile PCR was first performed which was positive. Patient was started on pulse dose oral vancomycin regimen. Despite taking this regimen faithfully at home patient continued to have severe diarrhea with poor p.o. intake as well as bright red blood per rectum. Also reports some abdominal pain and dizziness and lightheadedness. No fever no chills reported. No blood in the urine reported. Patient has significant weight loss over last 1 year. He is drinking Ensure but he is unable to keep any other food. Patient is on Remicade at home currently has undergone 4 doses.  ED Course:Directly admitted from clinic  Athisbaseline ambulates without any assistance, mother states that he is currently walking like a 23 year old. And is independent for most ofhisADL; manages hismedication on hisown.   Assessment & Plan:   Principal Problem:   Recurrent Clostridium difficile diarrhea Active Problems:   Ulcerative pancolitis without complication (HCC)   Weight loss   BRBPR (bright red blood per rectum)   Colitis   C. difficile colitis   #1 recurrent C. difficile colitis with ulcerative pancolitis he reports his diarrhea is slightly better than yesterday.  However he feels weak and tired.  Colonoscopy shows normal terminal ileum severe inflammation throughout the colon with friable edematous intermittently ulcerated erythematous mucosa from anus to the cecum.  The rectosigmoid with circumferential ulcers no overt pseudomembranes present but intermittent mucopurulence throughout the colon noted.  Solitary  deeply cratered ulcer in the cecum with unusual lower borders measuring 1 to 2 cm across this was biopsied extensively.  Severely inflamed colon mucosa was randomly biopsied.  Rectosigmoid ulcer was biopsied and sent for viral testing.  No obvious pseudomembranes were found.  His vancomycin dose was decreased to 125 mg every 6 also he was started on IV Solu-Medrol 20 mg every 8.  Patient reports that he ate red meat last night and did okay.  Viral culture pending.  Rest of the management per GI.  Encourage p.o. intake.    Estimated body mass index is 18.38 kg/m as calculated from the following:   Height as of this encounter: 5' 5"  (1.651 m).   Weight as of this encounter: 50.1 kg.   Antimicrobials: None  Subjective: Patient reports he is feeling slightly better than yesterday with decreased bouts of diarrhea no nausea or vomiting overnight was able to keep dinner down. Denies any lightheadedness dizziness   Objective: Vitals:   07/27/18 1448 07/27/18 1559 07/27/18 2018 07/28/18 0533  BP: (!) 90/56 109/67 105/63 122/77  Pulse: 92 99 97 83  Resp: 19 20 17 16   Temp: 98.4 F (36.9 C) 98.2 F (36.8 C) 98.3 F (36.8 C) 97.8 F (36.6 C)  TempSrc: Oral     SpO2: 100% 100% 100% 99%  Weight:    50.1 kg  Height:        Intake/Output Summary (Last 24 hours) at 07/28/2018 0759 Last data filed at 07/28/2018 0600 Gross per 24 hour  Intake 2883.5 ml  Output 500 ml  Net 2383.5 ml   Filed Weights   07/27/18 1115 07/28/18 0533  Weight: 46 kg 50.1 kg    Examination:  General exam: Appears calm and comfortable  Respiratory system: Clear to auscultation. Respiratory effort normal. Cardiovascular system: S1 & S2 heard, RRR. No JVD, murmurs, rubs, gallops or clicks. No pedal edema. Gastrointestinal system: Abdomen is nondistended, soft and tender. No organomegaly or masses felt. Normal bowel sounds heard. Central nervous system: Alert and oriented. No focal neurological  deficits. Extremities: Symmetric 5 x 5 power. Skin: No rashes, lesions or ulcers Psychiatry: Judgement and insight appear normal. Mood & affect appropriate.     Data Reviewed: I have personally reviewed following labs and imaging studies  CBC: Recent Labs  Lab 07/26/18 1737 07/27/18 0543 07/28/18 0541  WBC 10.2 9.0 4.9  NEUTROABS 6.6  --   --   HGB 11.6* 9.4* 9.2*  HCT 39.4 32.7* 31.8*  MCV 83.7 85.2 85.0  PLT 437* 299 970   Basic Metabolic Panel: Recent Labs  Lab 07/26/18 1737 07/27/18 0543 07/28/18 0541  NA 134* 137 137  K 4.6 4.1 3.9  CL 99 109 109  CO2 27 23 23   GLUCOSE 94 97 122*  BUN 21* 13 8  CREATININE 0.94 0.77 0.59*  CALCIUM 8.6* 7.8* 8.2*  MG 2.1  --   --   PHOS 4.5  --   --    GFR: Estimated Creatinine Clearance: 100 mL/min (A) (by C-G formula based on SCr of 0.59 mg/dL (L)). Liver Function Tests: Recent Labs  Lab 07/26/18 1737 07/27/18 0543 07/28/18 0541  AST 15 14* 12*  ALT 11 11 11   ALKPHOS 60 41 43  BILITOT 0.2* 0.5 0.3  PROT 7.4 5.7* 6.1*  ALBUMIN 3.7 2.7* 2.8*   No results for input(s): LIPASE, AMYLASE in the last 168 hours. No results for input(s): AMMONIA in the last 168 hours. Coagulation Profile: No results for input(s): INR, PROTIME in the last 168 hours. Cardiac Enzymes: No results for input(s): CKTOTAL, CKMB, CKMBINDEX, TROPONINI in the last 168 hours. BNP (last 3 results) No results for input(s): PROBNP in the last 8760 hours. HbA1C: No results for input(s): HGBA1C in the last 72 hours. CBG: No results for input(s): GLUCAP in the last 168 hours. Lipid Profile: No results for input(s): CHOL, HDL, LDLCALC, TRIG, CHOLHDL, LDLDIRECT in the last 72 hours. Thyroid Function Tests: No results for input(s): TSH, T4TOTAL, FREET4, T3FREE, THYROIDAB in the last 72 hours. Anemia Panel: No results for input(s): VITAMINB12, FOLATE, FERRITIN, TIBC, IRON, RETICCTPCT in the last 72 hours. Sepsis Labs: No results for input(s):  PROCALCITON, LATICACIDVEN in the last 168 hours.  No results found for this or any previous visit (from the past 240 hour(s)).       Radiology Studies: Dg Abd 2 Views  Result Date: 07/26/2018 CLINICAL DATA:  Recurrent colitis.  Left side abdominal pain. EXAM: ABDOMEN - 2 VIEW COMPARISON:  CT 07/14/2017 FINDINGS: The bowel gas pattern is normal. There is no evidence of free air. No radio-opaque calculi or other significant radiographic abnormality is seen. IMPRESSION: Negative. Electronically Signed   By: Rolm Baptise M.D.   On: 07/26/2018 18:31        Scheduled Meds: . feeding supplement (ENSURE ENLIVE)  237 mL Oral BID BM  . methylPREDNISolone (SOLU-MEDROL) injection  20 mg Intravenous Q8H  . saccharomyces boulardii  250 mg Oral BID  . vancomycin  125 mg Oral QID   Continuous Infusions: . sodium chloride 150 mL/hr at 07/28/18 0418     LOS: 1 day      Georgette Shell, MD [facility]  If 7PM-7AM, please contact night-coverage [email or web address] Password [email or web address] 07/28/2018,  7:59 AM

## 2018-07-28 NOTE — Progress Notes (Signed)
Initial Nutrition Assessment  DOCUMENTATION CODES:   Underweight, Severe malnutrition in context of chronic illness  INTERVENTION:  Ensure Enlive po BID, each supplement provides 350 kcal and 20 grams of protein Magic cup TID with meals, each supplement provides 290 kcal and 9 grams of protein MVI   NUTRITION DIAGNOSIS:   Severe Malnutrition related to chronic illness as evidenced by severe fat depletion, moderate muscle depletion, percent weight loss.   GOAL:   Patient will meet greater than or equal to 90% of their needs   MONITOR:   Weight trends, PO intake, Labs, Skin, Supplement acceptance  REASON FOR ASSESSMENT:   Malnutrition Screening Tool    ASSESSMENT:  23 year old male with recurrent C. Diff infection, significant PMH of ulcerative colitis with pancolitis.    Very pleasant patient who reports 95% of breakfast this morning (2 pieces of toast, applesauce, baked chips, lactacid, 2-3 bites of grilled chx breast) denies abdominal discomfort, nausea, diarrhea after his meal. Patient had grilled chz, applesauce, chocolate ice cream and 1/2 of rice for dinner.   At home, pt reports following a more bland food diet and recalls toast, [place] breast, bananas, 2 Ensure and takes lactulose before consuming dairy. When examining patient, RD noticed very dry patchy skin and suspect Essential FA deficiency is possible  Patient with questions about returning to more of a normal diet once in "remission" RD suggested keeping a food diary to keep track of how his body reacts to food and slowly introducing new foods.   He reports type 6 BM this morning and noticed less mucous and better form than previous BMs.   Patient reports recent UBW 103-106 lbs and recalls 120 lbs in November.   Severe changes in weight reported with 14lb loss x 4 months  Medications reviewed and include: Methylprednisolone, florastor,vancomycin  Labs: Glucose 122 (H)  NUTRITION - FOCUSED PHYSICAL  EXAM:    Most Recent Value  Orbital Region  Moderate depletion  Upper Arm Region  Severe depletion  Thoracic and Lumbar Region  Moderate depletion  Buccal Region  Severe depletion  Temple Region  Moderate depletion  Clavicle Bone Region  Severe depletion  Clavicle and Acromion Bone Region  Moderate depletion  Scapular Bone Region  Moderate depletion  Dorsal Hand  Moderate depletion  Patellar Region  Severe depletion  Anterior Thigh Region  Moderate depletion  Posterior Calf Region  Severe depletion  Edema (RD Assessment)  None  Hair  Reviewed  Eyes  Reviewed  Mouth  Reviewed  Skin  Reviewed [dry, patchy, xerosis]  Nails  Reviewed       Diet Order:   Diet Order            Diet regular Room service appropriate? Yes; Fluid consistency: Thin  Diet effective now              EDUCATION NEEDS:   Education needs have been addressed  Skin:     Last BM:  2/27 Type 6 (Brown, Med)  Height:   Ht Readings from Last 1 Encounters:  07/27/18 5' 5"  (1.651 m)    Weight: 110.2 lbs  Wt Readings from Last 1 Encounters:  07/28/18 50.1 kg   06/26/18 50.8 kg  05/22/18 54.4 kg   9.5 lb wt loss x 40mo- severe  Ideal Body Weight:  61.8 kg  BMI:  Body mass index is 18.38 kg/m.  Estimated Nutritional Needs:   Kcal:  1850-2000  Protein:  70-85 grams  Fluid:  2L/day  Lajuan Lines, RD, LDN  After Hours/Weekend Pager: [phone]

## 2018-07-29 NOTE — Progress Notes (Signed)
PROGRESS NOTE    Christian Cardenas  ZJI:[id number] DOB: 1995-10-23 DOA: 07/26/2018 PCP: Dettinger, Fransisca Kaufmann, MD   Brief Narrative:23 y.o.malewith Past medical history ofulcerative colitis with pancolitis, recurrent C. difficile infection.. Patient was seen in the clinic today due to persistent diarrhea. [place]iley on 07/12/2018 patient called GI office with reoccurrence of his diarrhea. C. difficile PCR was first performed which was positive. Patient was started on pulse dose oral vancomycin regimen. Despite taking this regimen faithfully at home patient continued to have severe diarrhea with poor p.o. intake as well as bright red blood per rectum. Also reports some abdominal pain and dizziness and lightheadedness. No fever no chills reported. No blood in the urine reported. Patient has significant weight loss over last 1 year. He is drinking Ensure but he is unable to keep any other food. Patient is on Remicade at home currently has undergone 4 doses.  ED Course:Directly admitted from clinic  Athisbaseline ambulates without any assistance, mother states that he is currently walking like a 23 year old. And is independent for most ofhisADL; manages hismedication on hisown.   Assessment & Plan:   Principal Problem:   Recurrent Clostridium difficile diarrhea Active Problems:   Ulcerative pancolitis without complication (HCC)   Weight loss   BRBPR (bright red blood per rectum)   Colitis   C. difficile colitis   Protein-calorie malnutrition, severe   #1 recurrent C. difficile colitis with ulcerative pancolitis. Colonoscopy shows normal terminal ileum severe inflammation throughout the colon with friable edematous intermittently ulcerated erythematous mucosa from anus to the cecum.  The rectosigmoid with circumferential ulcers no overt pseudomembranes present but intermittent mucopurulence throughout the colon noted.  Solitary deeply cratered ulcer in the cecum with unusual lower  borders measuring 1 to 2 cm across this was biopsied extensively.  Severely inflamed colon mucosa was randomly biopsied.  Rectosigmoid ulcer was biopsied and sent for viral testing.  No obvious pseudomembranes were found.  So His vancomycin dose was decreased to 125 mg every 6 also he was started on IV Solu-Medrol 20 mg every 8.  Viral culture   Showed no growth so far.  DVT prophylaxis: SCD  code Status: Full code Family Communication: None available at bedside Disposition Plan: Pending clinical improvement  Consultants:   GI  Procedures: None Antimicrobials:      Nutrition Problem: Severe Malnutrition Etiology: chronic illness     Signs/Symptoms: severe fat depletion, moderate muscle depletion, percent weight loss Percent weight loss: 11.7 %(14 lb wt loss x 4 months)    Interventions: Ensure Enlive (each supplement provides 350kcal and 20 grams of protein), MVI, Magic cup  Estimated body mass index is 17.61 kg/m as calculated from the following:   Height as of this encounter: 5' 5"  (1.651 m).   Weight as of this encounter: 48 kg.   Subjective: Resting sleeping reports he slept better trazodone helped him to sleep but had little bit more diarrhea than the previous night no nausea vomiting reported  Objective: Vitals:   07/28/18 1406 07/28/18 1556 07/28/18 2019 07/29/18 0545  BP: (!) 94/48  112/69 126/87  Pulse: (!) 106 98 (!) 102 80  Resp: 18  18 16   Temp: 98 F (36.7 C)  98 F (36.7 C) 97.8 F (36.6 C)  TempSrc:      SpO2: 99%  99% 98%  Weight:    48 kg  Height:        Intake/Output Summary (Last 24 hours) at 07/29/2018 1113 Last data filed at 07/28/2018 1500 Gross  per 24 hour  Intake 753.32 ml  Output -  Net 753.32 ml   Filed Weights   07/27/18 1115 07/28/18 0533 07/29/18 0545  Weight: 46 kg 50.1 kg 48 kg    Examination:  General exam: Appears calm and comfortable  Respiratory system: Clear to auscultation. Respiratory effort  normal. Cardiovascular system: S1 & S2 heard, RRR. No JVD, murmurs, rubs, gallops or clicks. No pedal edema. Gastrointestinal system: Abdomen is nondistended, soft and tender. No organomegaly or masses felt. Normal bowel sounds heard. Central nervous system: Alert and oriented. No focal neurological deficits. Extremities: Symmetric 5 x 5 power. Skin: No rashes, lesions or ulcers Psychiatry: Judgement and insight appear normal. Mood & affect appropriate.     Data Reviewed: I have personally reviewed following labs and imaging studies  CBC: Recent Labs  Lab 07/26/18 1737 07/27/18 0543 07/28/18 0541  WBC 10.2 9.0 4.9  NEUTROABS 6.6  --   --   HGB 11.6* 9.4* 9.2*  HCT 39.4 32.7* 31.8*  MCV 83.7 85.2 85.0  PLT 437* 299 917   Basic Metabolic Panel: Recent Labs  Lab 07/26/18 1737 07/27/18 0543 07/28/18 0541  NA 134* 137 137  K 4.6 4.1 3.9  CL 99 109 109  CO2 27 23 23   GLUCOSE 94 97 122*  BUN 21* 13 8  CREATININE 0.94 0.77 0.59*  CALCIUM 8.6* 7.8* 8.2*  MG 2.1  --   --   PHOS 4.5  --   --    GFR: Estimated Creatinine Clearance: 95.8 mL/min (A) (by C-G formula based on SCr of 0.59 mg/dL (L)). Liver Function Tests: Recent Labs  Lab 07/26/18 1737 07/27/18 0543 07/28/18 0541  AST 15 14* 12*  ALT 11 11 11   ALKPHOS 60 41 43  BILITOT 0.2* 0.5 0.3  PROT 7.4 5.7* 6.1*  ALBUMIN 3.7 2.7* 2.8*   No results for input(s): LIPASE, AMYLASE in the last 168 hours. No results for input(s): AMMONIA in the last 168 hours. Coagulation Profile: No results for input(s): INR, PROTIME in the last 168 hours. Cardiac Enzymes: No results for input(s): CKTOTAL, CKMB, CKMBINDEX, TROPONINI in the last 168 hours. BNP (last 3 results) No results for input(s): PROBNP in the last 8760 hours. HbA1C: No results for input(s): HGBA1C in the last 72 hours. CBG: No results for input(s): GLUCAP in the last 168 hours. Lipid Profile: No results for input(s): CHOL, HDL, LDLCALC, TRIG, CHOLHDL,  LDLDIRECT in the last 72 hours. Thyroid Function Tests: No results for input(s): TSH, T4TOTAL, FREET4, T3FREE, THYROIDAB in the last 72 hours. Anemia Panel: No results for input(s): VITAMINB12, FOLATE, FERRITIN, TIBC, IRON, RETICCTPCT in the last 72 hours. Sepsis Labs: No results for input(s): PROCALCITON, LATICACIDVEN in the last 168 hours.  Recent Results (from the past 240 hour(s))  Calprotectin, Fecal     Status: Abnormal   Collection Time: 07/26/18  5:02 PM  Result Value Ref Range Status   Calprotectin, Fecal 295 (H) 0 - 120 ug/g Final    Comment: (NOTE) Concentration     Interpretation   Follow-Up <16 - 50 ug/g     Normal           None >50 -120 ug/g     Borderline       Re-evaluate in 4-6 weeks    >120 ug/g     Abnormal         Repeat as clinically  indicated Performed At: [facility] [place], [place] [id number] Rush Farmer MD OO:[id number]   Virus culture     Status: None   Collection Time: 07/27/18  1:29 PM  Result Value Ref Range Status   Viral Culture Comment  Final    Comment: (NOTE) Preliminary Report: No virus isolated at 24 hours. Next report to follow after 4 days. Performed At: [facility] [place], [place] [id number] Rush Farmer MD [id number]    Source of Sample SIGMOID COLON  Final    Comment: Performed at [facility] [address], [place], [place] [address]         Radiology Studies: No results found.      Scheduled Meds: . feeding supplement (ENSURE ENLIVE)  237 mL Oral BID BM  . methylPREDNISolone (SOLU-MEDROL) injection  20 mg Intravenous Q8H  . multivitamin with minerals  1 tablet Oral Daily  . saccharomyces boulardii  250 mg Oral BID  . traZODone  50 mg Oral QHS  . vancomycin  125 mg Oral QID   Continuous Infusions: . sodium chloride 150 mL/hr at 07/28/18 0418     LOS: 2 days    Georgette Shell, MD [facility]  If 7PM-7AM, please contact night-coverage [email or web address] Password [email or web address] 07/29/2018, 11:13 AM

## 2018-07-30 LAB — COMPREHENSIVE METABOLIC PANEL
ALBUMIN: 3.1 g/dL — AB (ref 3.5–5.0)
ALT: 12 U/L (ref 0–44)
ANION GAP: 7 (ref 5–15)
AST: 14 U/L — ABNORMAL LOW (ref 15–41)
Alkaline Phosphatase: 45 U/L (ref 38–126)
BUN: 15 mg/dL (ref 6–20)
CO2: 26 mmol/L (ref 22–32)
Calcium: 8.6 mg/dL — ABNORMAL LOW (ref 8.9–10.3)
Chloride: 103 mmol/L (ref 98–111)
Creatinine, Ser: 0.62 mg/dL (ref 0.61–1.24)
GFR calc Af Amer: >60 mL/min (ref >60)
GFR calc non Af Amer: >60 mL/min (ref >60)
GLUCOSE: 122 mg/dL — AB (ref 70–99)
Potassium: 4.4 mmol/L (ref 3.5–5.1)
Sodium: 136 mmol/L (ref 135–145)
Total Bilirubin: 0.2 mg/dL — ABNORMAL LOW (ref 0.3–1.2)
Total Protein: 6.7 g/dL (ref 6.5–8.1)

## 2018-07-30 LAB — CBC
HCT: 36 % — ABNORMAL LOW (ref 39.0–52.0)
HEMOGLOBIN: 10.4 g/dL — AB (ref 13.0–17.0)
MCH: 24.6 pg — ABNORMAL LOW (ref 26.0–34.0)
MCHC: 28.9 g/dL — AB (ref 30.0–36.0)
MCV: 85.1 fL (ref 80.0–100.0)
Platelets: 393 10*3/uL (ref 150–400)
RBC: 4.23 MIL/uL (ref 4.22–5.81)
RDW: 14.1 % (ref 11.5–15.5)
WBC: 7.7 10*3/uL (ref 4.0–10.5)
nRBC: 0 % (ref 0.0–0.2)

## 2018-07-30 MED ORDER — SACCHAROMYCES BOULARDII 250 MG PO CAPS: 250.0000 mg | ORAL_CAPSULE | Freq: Two times a day (BID) | ORAL | 0 refills | Status: DC

## 2018-07-30 MED ORDER — PREDNISONE 10 MG PO TABS: ORAL_TABLET | ORAL | 0 refills | Status: DC

## 2018-07-30 MED ORDER — ONDANSETRON HCL 4 MG PO TABS: 4.0000 mg | ORAL_TABLET | Freq: Four times a day (QID) | ORAL | 0 refills | Status: DC | PRN

## 2018-07-30 NOTE — Discharge Summary (Signed)
Physician Discharge Summary  Christian Cardenas FFM:[id number] DOB: 12-Jan-1996 DOA: 07/26/2018  PCP: Worthy Rancher, MD  Admit date: 07/26/2018 Discharge date: 07/30/2018  Admitted From HOME Disposition: Home  Recommendations for Outpatient Follow-up:  1. Follow up with PCP in 1-2 weeks 2. Please obtain BMP/CBC in one week 3. Follow-up with GI in 1 week  Home Health none Equipment/Devices: None Discharge Condition: Stable CODE STATUS: Full code Diet recommendation: Regular  Brief/Interim Summary:23 y.o.malewith Past medical history ofulcerative colitis with pancolitis, recurrent C. difficile infection.. Patient was seen in the clinic today due to persistent diarrhea. Shiley on 07/12/2018 patient called GI office with reoccurrence of his diarrhea. C. difficile PCR was first performed which was positive. Patient was started on pulse dose oral vancomycin regimen. Despite taking this regimen faithfully at home patient continued to have severe diarrhea with poor p.o. intake as well as bright red blood per rectum. Also reports some abdominal pain and dizziness and lightheadedness. No fever no chills reported. No blood in the urine reported. Patient has significant weight loss over last 1 year. He is drinking Ensure but he is unable to keep any other food. Patient is on Remicade at home currently has undergone 4 doses.  ED Course:Directly admitted from clinic  Athisbaseline ambulates without any assistance, mother states that he is currently walking like a 23 year old. And is independent for most ofhisADL; manages hismedication on hisown.    Discharge Diagnoses:  Principal Problem:   Recurrent Clostridium difficile diarrhea Active Problems:   Ulcerative pancolitis without complication (HCC)   Weight loss   BRBPR (bright red blood per rectum)   Colitis   C. difficile colitis   Protein-calorie malnutrition, severe   #1 recurrent C. difficile colitis with ulcerative  pancolitis.Colonoscopy shows normal terminal ileum severe inflammation throughout the colon with friable edematous intermittently ulcerated erythematous mucosa from anus to the cecum. The rectosigmoid with circumferential ulcers no overt pseudomembranes present but intermittent mucopurulence throughout the colon noted. Solitary deeply cratered ulcer in the cecum with unusual lower borders measuring 1 to 2 cm across this was biopsied extensively. Severely inflamed colon mucosa was randomly biopsied. Rectosigmoid ulcer was biopsied and sent for viral testing. No obvious pseudomembranes were found.  SoHis vancomycin dose was decreased to 125 mg every 6 also he was started on IV Solu-Medrol 20 mg every 8.  Viral culture  Showed no growth so far.he will be discharged on oral vanco prednisone taper and probiotics.f/u with gi prior to return to work    Nutrition Problem: Severe Malnutrition Etiology: chronic illness    Signs/Symptoms: severe fat depletion, moderate muscle depletion, percent weight loss Percent weight loss: 11.7 %(14 lb wt loss x 4 months)     Interventions: Ensure Enlive (each supplement provides 350kcal and 20 grams of protein), MVI, Magic cup  Estimated body mass index is 18.14 kg/m as calculated from the following:   Height as of this encounter: 5' 5"  (1.651 m).   Weight as of this encounter: 49.4 kg.  Discharge Instructions  Discharge Instructions    Call MD for:  difficulty breathing, headache or visual disturbances   Complete by:  As directed    Call MD for:  persistant nausea and vomiting   Complete by:  As directed    Call MD for:  redness, tenderness, or signs of infection (pain, swelling, redness, odor or green/yellow discharge around incision site)   Complete by:  As directed    Diet - low sodium heart healthy   Complete by:  As directed    Increase activity slowly   Complete by:  As directed      Allergies as of 07/30/2018      Reactions   Eggs Or  Egg-derived Products Anaphylaxis   AND MAYONNAISE Eczema reaction too   Peanut Butter Flavor Anaphylaxis   ezcema issues also   Peanut-containing Drug Products Anaphylaxis      Medication List    STOP taking these medications   diphenhydrAMINE 50 MG tablet Commonly known as:  BENADRYL     TAKE these medications   betamethasone dipropionate 0.05 % cream Commonly known as:  DIPROLENE Apply topically 2 (two) times daily.   fluticasone 50 MCG/ACT nasal spray Commonly known as:  FLONASE Place 1 spray into both nostrils 2 (two) times daily as needed for allergies or rhinitis.   multivitamin with minerals Tabs tablet Take 1 tablet by mouth daily.   ondansetron 4 MG tablet Commonly known as:  ZOFRAN Take 1 tablet (4 mg total) by mouth every 6 (six) hours as needed for nausea.   predniSONE 10 MG tablet Commonly known as:  DELTASONE Prednisone 40 mg daily decrease by 10 mg every week till 10 mg daily till done What changed:    medication strength  additional instructions   REMICADE IV Inject into the vein every 8 (eight) weeks.   saccharomyces boulardii 250 MG capsule Commonly known as:  FLORASTOR Take 1 capsule (250 mg total) by mouth 2 (two) times daily.   triamcinolone cream 0.1 % Commonly known as:  KENALOG Apply 1 application topically daily as needed (for irritation).   vancomycin 125 MG capsule Commonly known as:  VANCOCIN HCL Take 1 capsule (125 mg total) by mouth 4 (four) times daily for 14 days, THEN 1 capsule (125 mg total) 3 (three) times daily for 14 days, THEN 1 capsule (125 mg total) 2 (two) times daily for 14 days, THEN 1 capsule (125 mg total) daily for 14 days, THEN 1 capsule (125 mg total) every other day for 14 days. Start taking on:  July 14, 2018      Follow-up Information    Dettinger, Fransisca Kaufmann, MD Follow up.   Specialties:  Family Medicine, Cardiology Contact information: [place] [address] [phone]         Levin Erp, PA Follow up.   Specialty:  Gastroenterology Contact information: [address] [place] [address] [phone]          Allergies  Allergen Reactions  . Eggs Or Egg-Derived Products Anaphylaxis    AND MAYONNAISE Eczema reaction too  . Peanut Butter Flavor Anaphylaxis    ezcema issues also  . Peanut-Containing Drug Products Anaphylaxis    Consultations:  gi   Procedures/Studies: Dg Abd 2 Views  Result Date: 07/26/2018 CLINICAL DATA:  Recurrent colitis.  Left side abdominal pain. EXAM: ABDOMEN - 2 VIEW COMPARISON:  CT 07/14/2017 FINDINGS: The bowel gas pattern is normal. There is no evidence of free air. No radio-opaque calculi or other significant radiographic abnormality is seen. IMPRESSION: Negative. Electronically Signed   By: Rolm Baptise M.D.   On: 07/26/2018 18:31   (Echo, Carotid, EGD, Colonoscopy, ERCP)    Subjective: Feels ok.no vomiting has some diarhhea  Discharge Exam: Vitals:   07/29/18 2108 07/30/18 0519  BP: 114/73 127/70  Pulse: 94 94  Resp: 18 18  Temp: 97.6 F (36.4 C) 97.7 F (36.5 C)  SpO2: 99% 99%   Vitals:   07/29/18  8563 07/29/18 1417 07/29/18 2108 07/30/18 0519  BP: 126/87 116/75 114/73 127/70  Pulse: 80 91 94 94  Resp: 16  18 18   Temp: 97.8 F (36.6 C) 98.1 F (36.7 C) 97.6 F (36.4 C) 97.7 F (36.5 C)  TempSrc:   Oral Oral  SpO2: 98% 99% 99% 99%  Weight: 48 kg   49.4 kg  Height:        General: Pt is alert, awake, not in acute distress Cardiovascular: RRR, S1/S2 +, no rubs, no gallops Respiratory: CTA bilaterally, no wheezing, no rhonchi Abdominal: Soft, NT, ND, bowel sounds + Extremities: no edema, no cyanosis    The results of significant diagnostics from this hospitalization (including imaging, microbiology, ancillary and laboratory) are listed below for reference.     Microbiology: Recent Results (from the past 240 hour(s))  Calprotectin, Fecal     Status: Abnormal    Collection Time: 07/26/18  5:02 PM  Result Value Ref Range Status   Calprotectin, Fecal 295 (H) 0 - 120 ug/g Final    Comment: (NOTE) Concentration     Interpretation   Follow-Up <16 - 50 ug/g     Normal           None >50 -120 ug/g     Borderline       Re-evaluate in 4-6 weeks    >120 ug/g     Abnormal         Repeat as clinically                                   indicated Performed At: [facility] [place], [place] [id number] Rush Farmer MD CH:[id number]   Virus culture     Status: None   Collection Time: 07/27/18  1:29 PM  Result Value Ref Range Status   Viral Culture Comment  Final    Comment: (NOTE) Preliminary Report: No virus isolated at 24 hours. Next report to follow after 4 days. Performed At: [facility] [place], [place] [id number] Rush Farmer MD NO:[id number]    Source of Sample SIGMOID COLON  Final    Comment: Performed at [place] [address], [place], [place] [address]     Labs: BNP (last 3 results) No results for input(s): BNP in the last 8760 hours. Basic Metabolic Panel: Recent Labs  Lab 07/26/18 1737 07/27/18 0543 07/28/18 0541 07/30/18 0559  NA 134* 137 137 136  K 4.6 4.1 3.9 4.4  CL 99 109 109 103  CO2 27 23 23 26   GLUCOSE 94 97 122* 122*  BUN 21* 13 8 15   CREATININE 0.94 0.77 0.59* 0.62  CALCIUM 8.6* 7.8* 8.2* 8.6*  MG 2.1  --   --   --   PHOS 4.5  --   --   --    Liver Function Tests: Recent Labs  Lab 07/26/18 1737 07/27/18 0543 07/28/18 0541 07/30/18 0559  AST 15 14* 12* 14*  ALT 11 11 11 12   ALKPHOS 60 41 43 45  BILITOT 0.2* 0.5 0.3 0.2*  PROT 7.4 5.7* 6.1* 6.7  ALBUMIN 3.7 2.7* 2.8* 3.1*   No results for input(s): LIPASE, AMYLASE in the last 168 hours. No results for input(s): AMMONIA in the last 168 hours. CBC: Recent Labs  Lab 07/26/18 1737 07/27/18 0543 07/28/18 0541 07/30/18 0559  WBC 10.2 9.0 4.9 7.7  NEUTROABS 6.6  --   --   --  HGB 11.6* 9.4* 9.2* 10.4*  HCT 39.4 32.7* 31.8* 36.0*  MCV 83.7 85.2 85.0 85.1  PLT 437* 299 290 393   Cardiac Enzymes: No results for input(s): CKTOTAL, CKMB, CKMBINDEX, TROPONINI in the last 168 hours. BNP: Invalid input(s): POCBNP CBG: No results for input(s): GLUCAP in the last 168 hours. D-Dimer No results for input(s): DDIMER in the last 72 hours. Hgb A1c No results for input(s): HGBA1C in the last 72 hours. Lipid Profile No results for input(s): CHOL, HDL, LDLCALC, TRIG, CHOLHDL, LDLDIRECT in the last 72 hours. Thyroid function studies No results for input(s): TSH, T4TOTAL, T3FREE, THYROIDAB in the last 72 hours.  Invalid input(s): FREET3 Anemia work up No results for input(s): VITAMINB12, FOLATE, FERRITIN, TIBC, IRON, RETICCTPCT in the last 72 hours. Urinalysis    Component Value Date/Time   COLORURINE STRAW (A) 01/10/2018 1635   APPEARANCEUR CLEAR 01/10/2018 1635   LABSPEC 1.011 01/10/2018 1635   PHURINE 7.0 01/10/2018 1635   GLUCOSEU NEGATIVE 01/10/2018 1635   HGBUR NEGATIVE 01/10/2018 1635   BILIRUBINUR NEGATIVE 01/10/2018 1635   KETONESUR NEGATIVE 01/10/2018 1635   PROTEINUR NEGATIVE 01/10/2018 1635   NITRITE NEGATIVE 01/10/2018 1635   LEUKOCYTESUR NEGATIVE 01/10/2018 1635   Sepsis Labs Invalid input(s): PROCALCITONIN,  WBC,  LACTICIDVEN Microbiology Recent Results (from the past 240 hour(s))  Calprotectin, Fecal     Status: Abnormal   Collection Time: 07/26/18  5:02 PM  Result Value Ref Range Status   Calprotectin, Fecal 295 (H) 0 - 120 ug/g Final    Comment: (NOTE) Concentration     Interpretation   Follow-Up <16 - 50 ug/g     Normal           None >50 -120 ug/g     Borderline       Re-evaluate in 4-6 weeks    >120 ug/g     Abnormal         Repeat as clinically                                   indicated Performed At: [facility] [place], [place] [id number] Rush Farmer MD GY:[id number]   Virus culture     Status: None    Collection Time: 07/27/18  1:29 PM  Result Value Ref Range Status   Viral Culture Comment  Final    Comment: (NOTE) Preliminary Report: No virus isolated at 24 hours. Next report to follow after 4 days. Performed At: [facility] [place], [place] [id number] Rush Farmer MD IO:[id number]    Source of Sample SIGMOID COLON  Final    Comment: Performed at [place] [address], [place], [place] [address]     Time coordinating discharge: 34 minutes  SIGNED:   Georgette Shell, MD  [facility] 07/30/2018, 1:30 PM Pager   If 7PM-7AM, please contact night-coverage [email or web address] Password [email or web address]

## 2018-08-02 LAB — OVA + PARASITE EXAM

## 2018-08-02 LAB — O&P RESULT

## 2018-08-04 LAB — VIRUS CULTURE

## 2018-08-17 ENCOUNTER — Telehealth: Payer: Self-pay

## 2018-08-17 NOTE — Telephone Encounter (Signed)
Covid-19 travel screening questions  Have you traveled in the last 14 days? NO If yes where?  Do you now or have you had a fever in the last 14 days? NO  Do you have any respiratory symptoms of shortness of breath or cough now or in the last 14 days? NO  Do you have any family members or close contacts with diagnosed or suspected Covid-19? NO

## 2018-08-18 ENCOUNTER — Encounter: Payer: Self-pay | Admitting: Gastroenterology

## 2018-08-18 ENCOUNTER — Ambulatory Visit: Payer: BLUE CROSS/BLUE SHIELD | Admitting: Gastroenterology

## 2018-08-18 ENCOUNTER — Other Ambulatory Visit: Payer: Self-pay

## 2018-08-18 VITALS — BP 92/60 | HR 120 | Temp 98.2°F | Ht 65.0 in | Wt 116.0 lb

## 2018-08-18 DIAGNOSIS — K5100 Ulcerative (chronic) pancolitis without complications: Secondary | ICD-10-CM

## 2018-08-18 DIAGNOSIS — K51919 Ulcerative colitis, unspecified with unspecified complications: Secondary | ICD-10-CM | POA: Diagnosis not present

## 2018-08-18 NOTE — Patient Instructions (Addendum)
remicade therapeutic drug levels and antibody test on Tuesday March 31. Continue with remicade infusion on April 1st.  Stay on 62m prednisone for another 2 weeks then taper to 186mdaily.  Please return to see Dr. JaArdis Hughsn 3-4 weeks.  Note for work.  To help prevent the possible spread of infection to our patients, communities, and staff; we will be implementing the following measures:  Please only allow one visitor/family member to accompany you to any upcoming appointments with [facility]. If you have any concerns about this please contact our office to discuss prior to the appointment.   Thank you for entrusting me with your care and choosing [facility] Dr JaArdis Hughs

## 2018-08-18 NOTE — Progress Notes (Signed)
Review of pertinent gastrointestinal problems: 1.Extensive, severeUC:Presented with abdominal pain, bloody diarrhea February 2019. CT scan showed pan colonic inflammation. Colonoscopy, Dr. Ardis Hughs April 2019 showed moderate to severe inflammation throughout his colon. Biopsies from this confirmed inflammatory bowel disease without granulomas. There was no rectal sparing. The terminal ileum was normal. He responded to prednisone and was started on oral full strength mesalamine.10/2017 he was clearly steroid dependent (could not wean off steroids without colitis symptoms recurring) and so decided to start entyvio  10/2017 labs: Hepatitis B surface antigen and antibody negative, Hepatitis C Ab negative, HIV negative, TB quant gold negative.  11/2017 Entyvio new start.  01/2018 seemingly non-responder to entyvio (sed rate 52, Gi path panel negative, clinically unchanged), so changed to remicade 70m/kg  Colonoscopy February 2020 showed normal terminal ileum, severe inflammation from anus to cecum.  Multiple biopsies showed inflammation, no sign of pseudomembranes, no sign of viral infection. 2. Acute C. Diff+ diarrhea, PCR confirmed 05/2017, his likely he caught this from his brother who had c. Diff a few weeks prior.  Started on vanc 1276mPO QID for 2 weeks.  The C. difficile seem to significantly flare his ulcerative colitis, see colonoscopy above February 2020 while he was admitted to the hospital for stabilization, hydration.   HPI: This is a very pleasant 23year old man whom I last saw when he was hospitalized for a 5-day stay for severe colitis flare.  He was discharged 3 weeks ago.  Since then he has been slowly tapering prednisone.  Currently at 2080mrednisone daily.  Should be tapering to 28m65m 5 days.BMs putty, somewhat keeping shape.  Bleeding has really tapered but still mild intermittently.  A lot of gas.  Has 5 or more BMs daily.  Much less nocturnal symptoms.  No fevers or  chills.  Next infliximab April 1st   Chief complaint is ulcerative colitis  ROS: complete GI ROS as described in HPI, all other review negative.  Constitutional:  No unintentional weight loss  He has gained 15 pounds in 3 or 4 weeks, same scale here in our GI office.   Past Medical History:  Diagnosis Date  . Clostridioides difficile infection 05/2018  . Eczema   [facility]  Past Surgical History:  Procedure Laterality Date  . BIOPSY  07/27/2018   Procedure: BIOPSY;  Surgeon: JacoMilus Banister;  [facility];  Service: Gastroenterology;;  . COLONOSCOPY W/ BIOPSIES    . COLONOSCOPY WITH PROPOFOL N/A 07/27/2018   Procedure: COLONOSCOPY WITH PROPOFOL;  Surgeon: JacoMilus Banister;  Location: [facility];  Service: Gastroenterology;  Laterality: N/A;  . MOUTH SURGERY      Current Outpatient Medications  Medication Sig Dispense Refill  . betamethasone dipropionate (DIPROLENE) 0.05 % cream Apply topically 2 (two) times daily. 60 g 1  . fluticasone (FLONASE) 50 MCG/ACT nasal spray Place 1 spray into both nostrils 2 (two) times daily as needed for allergies or rhinitis. 16 g 6  . inFLIXimab (REMICADE IV) Inject into the vein every 8 (eight) weeks.    . Multiple Vitamin (MULTIVITAMIN WITH MINERALS) TABS tablet Take 1 tablet by mouth daily.    . ondansetron (ZOFRAN) 4 MG tablet Take 1 tablet (4 mg total) by mouth every 6 (six) hours as needed for nausea. 20 tablet 0  . predniSONE (DELTASONE) 10 MG tablet Prednisone 40 mg daily decrease by 10 mg every week till 10 mg daily till done 60 tablet 0  . saccharomyces  boulardii (FLORASTOR) 250 MG capsule Take 1 capsule (250 mg total) by mouth 2 (two) times daily. 60 capsule 0  . triamcinolone cream (KENALOG) 0.1 % Apply 1 application topically daily as needed (for irritation).   1  . vancomycin (VANCOCIN HCL) 125 MG capsule Take 1 capsule (125 mg total) by mouth 4 (four) times daily for 14 days, THEN 1 capsule  (125 mg total) 3 (three) times daily for 14 days, THEN 1 capsule (125 mg total) 2 (two) times daily for 14 days, THEN 1 capsule (125 mg total) daily for 14 days, THEN 1 capsule (125 mg total) every other day for 14 days. 147 capsule 0   No current facility-administered medications for this visit.     Allergies as of 08/18/2018 - Review Complete 08/18/2018  Allergen Reaction Noted  . Eggs or egg-derived products Anaphylaxis 09/19/2012  . Peanut butter flavor Anaphylaxis 09/19/2012  . Peanut-containing drug products Anaphylaxis 09/19/2012    Family History  Problem Relation Age of Onset  . Seizures Mother   . Bipolar disorder Mother   . Hyperlipidemia Father   . Bipolar disorder Brother   . Anxiety disorder Brother   . Colon cancer Maternal Grandmother   . Stroke Maternal Grandmother   . Liver cancer Maternal Grandfather   . Esophageal cancer Neg Hx     Social History   Socioeconomic History  . Marital status: Single    Spouse name: Not on file  . Number of children: 0  . Years of education: Not on file  . Highest education level: Not on file  Occupational History  . Occupation: Actuary    Comment: in [place]  . Financial resource strain: Not on file  . Food insecurity:    Worry: Not on file    Inability: Not on file  . Transportation needs:    Medical: Not on file    Non-medical: Not on file  Tobacco Use  . Smoking status: Never Smoker  . Smokeless tobacco: Never Used  Substance and Sexual Activity  . Alcohol use: No  . Drug use: No  . Sexual activity: Not Currently  Lifestyle  . Physical activity:    Days per week: Not on file    Minutes per session: Not on file  . Stress: Not on file  Relationships  . Social connections:    Talks on phone: Not on file    Gets together: Not on file    Attends religious service: Not on file    Active member of club or organization: Not on file    Attends meetings of clubs or organizations: Not on file     Relationship status: Not on file  . Intimate partner violence:    Fear of current or ex partner: Not on file    Emotionally abused: Not on file    Physically abused: Not on file    Forced sexual activity: Not on file  Other Topics Concern  . Not on file  Social History Narrative  . Not on file     Physical Exam: Temp 98.2 F (36.8 C)   Ht 5' 5"  (1.651 m)   Wt 116 lb (52.6 kg)   BMI 19.30 kg/m  Constitutional: generally well-appearing Psychiatric: alert and oriented x3 Abdomen: soft, nontender, nondistended, no obvious ascites, no peritoneal signs, normal bowel sounds No peripheral edema noted in lower extremities  Assessment and plan: 23 y.o. male with ulcerative colitis  He caught C. difficile from his brother  and that caused a severe flare of his ulcerative colitis.  Colonoscopy while hospitalized 3 or 4 weeks ago showed no pseudomembranes.  No viral infections.  He completed his vancomycin course and has been gradually tapering prednisone which I have hoped could recapture him.  Clinically he is doing quite well.  He looks significantly better.  Much better color to his skin, he has gained about 15 pounds since he left the hospital.  He is still having some loose stools minor bleeding some urgency but overall much improved.  I asked him to stay on 20 mg of prednisone for another 2 weeks and then taper to 10 mg daily.  He is due for his next Remicade infusion at 5 mg/kg on April 1.  I will have therapeutic drug levels checked the day prior and also will check for antibody development the day prior.  He will return to see me a week or 2 after that and knows to call here sooner if he is having any troubles.  Currently ongoing coronavirus pandemic worldwide.  He has a job at a [employer] as a Scientist, water quality and again to write a note for him to give to his employer expressing my concern since he has such immune compromised state that I think he should not be working with the  public for now.  Please see the "Patient Instructions" section for addition details about the plan.  [name], MD [facility] 08/18/2018, 2:25 PM

## 2018-08-25 ENCOUNTER — Telehealth: Payer: Self-pay | Admitting: Gastroenterology

## 2018-08-25 NOTE — Telephone Encounter (Signed)
Patient got a letter from [place] about his Remicade reimbursement.  He is advised to call [place] next week when they reopen to discuss.

## 2018-08-25 NOTE — Telephone Encounter (Signed)
Pt received a letter regarding his remicade and would like to speak with you about it.

## 2018-08-29 ENCOUNTER — Other Ambulatory Visit: Payer: BLUE CROSS/BLUE SHIELD

## 2018-08-29 DIAGNOSIS — K51919 Ulcerative colitis, unspecified with unspecified complications: Secondary | ICD-10-CM | POA: Diagnosis not present

## 2018-08-30 DIAGNOSIS — K5190 Ulcerative colitis, unspecified, without complications: Secondary | ICD-10-CM | POA: Diagnosis not present

## 2018-09-05 LAB — INFLIXIMAB LEVEL,ADA FOR RHEUMATIC DZ

## 2018-09-05 LAB — INFLIXIMAB LEVEL AND ADA FOR IBD
Infliximab ADA, IBD: 24 AU — ABNORMAL HIGH (ref <10)
Infliximab Level, IBD: <0.8 ug/mL

## 2018-09-18 ENCOUNTER — Encounter: Payer: Self-pay | Admitting: Gastroenterology

## 2018-09-18 ENCOUNTER — Other Ambulatory Visit: Payer: BLUE CROSS/BLUE SHIELD

## 2018-09-18 ENCOUNTER — Ambulatory Visit (INDEPENDENT_AMBULATORY_CARE_PROVIDER_SITE_OTHER): Payer: BLUE CROSS/BLUE SHIELD | Admitting: Gastroenterology

## 2018-09-18 ENCOUNTER — Other Ambulatory Visit: Payer: Self-pay

## 2018-09-18 VITALS — BP 92/60 | HR 96 | Ht 65.0 in | Wt 119.0 lb

## 2018-09-18 DIAGNOSIS — K51919 Ulcerative colitis, unspecified with unspecified complications: Secondary | ICD-10-CM | POA: Diagnosis not present

## 2018-09-18 NOTE — Progress Notes (Signed)
Review of pertinent gastrointestinal problems: 1.Extensive, severeUC:Presented with abdominal pain, bloody diarrhea February 2019. CT scan showed pan colonic inflammation. Colonoscopy, Dr. Ardis Hughs April 2019 showed moderate to severe inflammation throughout his colon. Biopsies from this confirmed inflammatory bowel disease without granulomas. There was no rectal sparing. The terminal ileum was normal. He responded to prednisone and was started on oral full strength mesalamine.10/2017 he was clearly steroid dependent (could not wean off steroids without colitis symptoms recurring) and so decided to start entyvio  10/2017 labs: Hepatitis B surface antigen and antibody negative, Hepatitis C Ab negative, HIV negative, TB quant gold negative,TPMT normal metabolizer.  11/2017 Entyvio new start.  01/2018 seemingly non-responder to entyvio (sed rate 52, Gi path panel negative, clinically unchanged), so changed to remicade 62m/kg  Colonoscopy February 2020 showed normal terminal ileum, severe inflammation from anus to cecum.  Multiple biopsies showed inflammation, no sign of pseudomembranes, no sign of viral infection.  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 Acute C. Diff+ diarrhea,PCR confirmed 05/2017, his likely he caught this from his brother who had c. Diff a few weeks prior. Started on vanc 1232mPO QID for 2 weeks.  The C. difficile seem to significantly flare his ulcerative colitis, see colonoscopy above February 2020 while he was admitted to the hospital for stabilization, hydration.   This service was provided via virtual visit.  Both audio and visual were used.    The patient was located at home.  I was located in my office.  The patient did consent to this virtual visit and is aware of possible charges through their insurance for this visit.  The patient is an established patient.  My certified medical assistant contributed to this  visit by contacting the patient by phone 1 or 2 business days prior to the appointment and also followed up on the recommendations I made after the visit.  Time spent on virtual visit: 28 min   HPI: This is a very pleasant [age]ear old man whom I last saw here in the office 1 or 2 months ago  His most recent Remicade last infusion was about 2 weeks ago.  Remicade trough level was undetectable and Remicade antibodies have clearly developed based on lab tests 2 weeks ago.  He is weaning off prednisone and is noticing a bit of loosening of his stools, a bit explosive, mild cramping.  He is currently on 109mvery other day currently.  Overall feeling much much better than he has been over the past 2 or 3 months  Chief complaint is extensive, severe ulcerative colitis  ROS: complete GI ROS as described in HPI, all other review negative.  Constitutional:  No unintentional weight loss   Past Medical History:  Diagnosis Date  . Clostridioides difficile infection 05/2018  . Eczema   . [facility]   Past Surgical History:  Procedure Laterality Date  . BIOPSY  07/27/2018   Procedure: BIOPSY;  Surgeon: JacMilus BanisterD;  [facility];  Service: Gastroenterology;;  . COLONOSCOPY W/ BIOPSIES    . COLONOSCOPY WITH PROPOFOL N/A 07/27/2018   Procedure: COLONOSCOPY WITH PROPOFOL;  Surgeon: JacMilus BanisterD;  Location: [facility];  Service: Gastroenterology;  Laterality: N/A;  . MOUTH SURGERY      Current Outpatient Medications  Medication Sig Dispense Refill  . betamethasone dipropionate (DIPROLENE) 0.05 % cream Apply topically 2 (two) times daily. 60 g 1  . fluticasone (FLONASE) 50 MCG/ACT nasal spray Place  1 spray into both nostrils 2 (two) times daily as needed for allergies or rhinitis. 16 g 6  . inFLIXimab (REMICADE IV) Inject into the vein every 8 (eight) weeks.    . Multiple Vitamin (MULTIVITAMIN WITH MINERALS) TABS tablet Take 1 tablet by mouth daily.     . predniSONE (DELTASONE) 10 MG tablet Prednisone 40 mg daily decrease by 10 mg every week till 10 mg daily till done 60 tablet 0  . saccharomyces boulardii (FLORASTOR) 250 MG capsule Take 1 capsule (250 mg total) by mouth 2 (two) times daily. 60 capsule 0  . triamcinolone cream (KENALOG) 0.1 % Apply 1 application topically daily as needed (for irritation).   1  . vancomycin (VANCOCIN HCL) 125 MG capsule Take 1 capsule (125 mg total) by mouth 4 (four) times daily for 14 days, THEN 1 capsule (125 mg total) 3 (three) times daily for 14 days, THEN 1 capsule (125 mg total) 2 (two) times daily for 14 days, THEN 1 capsule (125 mg total) daily for 14 days, THEN 1 capsule (125 mg total) every other day for 14 days. 147 capsule 0   No current facility-administered medications for this visit.     Allergies as of 09/18/2018 - Review Complete 09/13/2018  Allergen Reaction Noted  . Eggs or egg-derived products Anaphylaxis 09/19/2012  . Peanut butter flavor Anaphylaxis 09/19/2012  . Peanut-containing drug products Anaphylaxis 09/19/2012    Family History  Problem Relation Age of Onset  . Seizures Mother   . Bipolar disorder Mother   . Hyperlipidemia Father   . Bipolar disorder Brother   . Anxiety disorder Brother   . Colon cancer Maternal Grandmother   . Stroke Maternal Grandmother   . Liver cancer Maternal Grandfather   . Esophageal cancer Neg Hx     Social History   Socioeconomic History  . Marital status: Single    Spouse name: Not on file  . Number of children: 0  . Years of education: Not on file  . Highest education level: Not on file  Occupational History  . Occupation: Actuary    Comment: in [place]  . Financial resource strain: Not on file  . Food insecurity:    Worry: Not on file    Inability: Not on file  . Transportation needs:    Medical: Not on file    Non-medical: Not on file  Tobacco Use  . Smoking status: Never Smoker  . Smokeless tobacco:  Never Used  Substance and Sexual Activity  . Alcohol use: No  . Drug use: No  . Sexual activity: Not Currently  Lifestyle  . Physical activity:    Days per week: Not on file    Minutes per session: Not on file  . Stress: Not on file  Relationships  . Social connections:    Talks on phone: Not on file    Gets together: Not on file    Attends religious service: Not on file    Active member of club or organization: Not on file    Attends meetings of clubs or organizations: Not on file    Relationship status: Not on file  . Intimate partner violence:    Fear of current or ex partner: Not on file    Emotionally abused: Not on file    Physically abused: Not on file    Forced sexual activity: Not on file  Other Topics Concern  . Not on file  Social History Narrative  . Not  on file     Physical Exam: Unable to perform because this was a "telemed visit" due to current Covid-19 pandemic BP 92/60   Pulse 96   Ht 5' 5"  (1.651 m)   Wt 119 lb (54 kg)   BMI 19.80 kg/m    Assessment and plan: 23 y.o. male with extensive, severe ulcerative colitis  He has unfortunately developed Remicade antibodies.  We are going to remove that medicine from his drug list.  He is going to stop tapering from prednisone and hold at 10 mg every single day for now.  We will send labs checking for anti-vedolizumab (entyvio) antibodies.  Hopefully he has not developed antibodies to that as well.  I am going to start him on azathioprine 100 mg once daily.  That is about 2 mg/kg weight-based.  He will continue his vancomycin taper for now.  If he has not developed antibodies to [facility] then I am likely going to restart that biologic medicine.  Please see the "Patient Instructions" section for addition details about the plan.  [name], MD [facility] 09/18/2018, 11:10 AM

## 2018-09-18 NOTE — Patient Instructions (Addendum)
He needs lab testing today or tomorrow (anti-vedolizumab antibodies).    He is going to continue taking prednisone but increase a bit to 10 mg every single day instead of every other day  He is going to complete his vancomycin taper as previously scheduled  He is going to start a new medicine, azathioprine, 100 mg pills 1 pill once daily, dispense 1 month with 6 refills.  This is about 2 mg/kg given his weight.  He is going to stop taking Remicade since he has developed antibodies to it.  We will remove that from his medicine list.

## 2018-09-25 LAB — VEDOLIZUMAB AND ANTI-VEDO AB
Anti-Vedolizumab Antibody: <25 ng/mL
Vedolizumab: <1.3 ug/mL

## 2018-09-26 ENCOUNTER — Telehealth: Payer: Self-pay | Admitting: Gastroenterology

## 2018-09-26 NOTE — Telephone Encounter (Signed)
The labs have not been reviewed and the pt will be contacted as soon as Dr Ardis Hughs reviews. The patient has been notified of this information and all questions answered.

## 2018-09-27 ENCOUNTER — Telehealth: Payer: Self-pay | Admitting: Gastroenterology

## 2018-09-27 NOTE — Telephone Encounter (Signed)
Pt called to inform that his pharmacy never received prescription for azathioprine.

## 2018-09-28 ENCOUNTER — Other Ambulatory Visit: Payer: Self-pay | Admitting: Gastroenterology

## 2018-09-28 MED ORDER — AZATHIOPRINE 100 MG PO TABS: 100.0000 mg | ORAL_TABLET | Freq: Every day | ORAL | 6 refills | Status: DC

## 2018-09-28 NOTE — Telephone Encounter (Signed)
Medication sent to pharmacy  

## 2018-09-28 NOTE — Progress Notes (Signed)
azathioprine

## 2018-10-02 ENCOUNTER — Other Ambulatory Visit: Payer: Self-pay

## 2018-10-02 ENCOUNTER — Telehealth: Payer: Self-pay | Admitting: Gastroenterology

## 2018-10-02 DIAGNOSIS — K51919 Ulcerative colitis, unspecified with unspecified complications: Secondary | ICD-10-CM

## 2018-10-02 NOTE — Telephone Encounter (Signed)
Pt called stating that he was returning your call and would like for you to call back.

## 2018-10-02 NOTE — Telephone Encounter (Signed)
See result note.  

## 2018-10-16 ENCOUNTER — Other Ambulatory Visit: Payer: Self-pay

## 2018-10-16 ENCOUNTER — Encounter: Payer: Self-pay | Admitting: Gastroenterology

## 2018-10-16 ENCOUNTER — Other Ambulatory Visit (INDEPENDENT_AMBULATORY_CARE_PROVIDER_SITE_OTHER): Payer: BLUE CROSS/BLUE SHIELD

## 2018-10-16 ENCOUNTER — Ambulatory Visit (INDEPENDENT_AMBULATORY_CARE_PROVIDER_SITE_OTHER): Payer: BLUE CROSS/BLUE SHIELD | Admitting: Gastroenterology

## 2018-10-16 VITALS — Ht 65.0 in | Wt 119.0 lb

## 2018-10-16 DIAGNOSIS — K5100 Ulcerative (chronic) pancolitis without complications: Secondary | ICD-10-CM

## 2018-10-16 LAB — CBC WITH DIFFERENTIAL/PLATELET
Basophils Absolute: 0 10*3/uL (ref 0.0–0.1)
Basophils Relative: 0.5 % (ref 0.0–3.0)
Eosinophils Absolute: 0.4 10*3/uL (ref 0.0–0.7)
Eosinophils Relative: 5.4 % — ABNORMAL HIGH (ref 0.0–5.0)
HCT: 41.5 % (ref 39.0–52.0)
Hemoglobin: 13.8 g/dL (ref 13.0–17.0)
Lymphocytes Relative: 26.1 % (ref 12.0–46.0)
Lymphs Abs: 1.9 10*3/uL (ref 0.7–4.0)
MCHC: 33.3 g/dL (ref 30.0–36.0)
MCV: 86.2 fl (ref 78.0–100.0)
Monocytes Absolute: 0.6 10*3/uL (ref 0.1–1.0)
Monocytes Relative: 8.3 % (ref 3.0–12.0)
Neutro Abs: 4.4 10*3/uL (ref 1.4–7.7)
Neutrophils Relative %: 59.7 % (ref 43.0–77.0)
Platelets: 229 10*3/uL (ref 150.0–400.0)
RBC: 4.81 Mil/uL (ref 4.22–5.81)
RDW: 18.8 % — ABNORMAL HIGH (ref 11.5–15.5)
WBC: 7.4 10*3/uL (ref 4.0–10.5)

## 2018-10-16 NOTE — Patient Instructions (Addendum)
He will have labs today or tomorrow (cbc, cmet)  We will arrange ROV in 3-4 weeks with me (in person hopefully)  He has not yet started entyvio load protocol, has been waiting to hear from the infusion office, we will contact them about ASAP start.  He will continue prednisone without tapering for now.  Will continue azathiaprine 145m daily as well.

## 2018-10-16 NOTE — Progress Notes (Signed)
Review of pertinent gastrointestinal problems: 1.Extensive, severeUC:Presented with abdominal pain, bloody diarrhea February 2019. CT scan showed pan colonic inflammation. Colonoscopy, Dr. Ardis Hughs April 2019 showed moderate to severe inflammation throughout his colon. Biopsies from this confirmed inflammatory bowel disease without granulomas. There was no rectal sparing. The terminal ileum was normal. He responded to prednisone and was started on oral full strength mesalamine.10/2017 he was clearly steroid dependent (could not wean off steroids without colitis symptoms recurring) and so decided to start entyvio  10/2017 labs: Hepatitis B surface antigen and antibody negative, Hepatitis C Ab negative, HIV negative, TB quant gold negative,TPMT normal metabolizer.  11/2017 Entyvio new start.  01/2018 seemingly non-responder to entyvio (sed rate 52, Gi path panel negative, clinically unchanged), so changed to remicade 66m/kg  Colonoscopy February 2020 showed normal terminal ileum, severe inflammation from anus to cecum. Multiple biopsies showed inflammation, no sign of pseudomembranes, no sign of viral infection.  March 2020 Remicade level undetectable, positive Remicade antibodies have developed (ADA 24AU)  4/20: entyvio antibodies negative; restarted entyvio at usual load protocol, also added azathiaprine 22mkg 2. Acute C. Diff+ diarrhea,PCR confirmed 05/2017, his likely he caught this from his brother who had c. Diff a few weeks prior. Started on vanc 12559mO QID for 2 weeks.The C. difficile seem to significantly flare his ulcerative colitis,seecolonoscopy above February 2020 while he was admitted to the hospital for stabilization, hydration.    This service was provided via virtual visit.  Both audio and visual were attempted but video did not function well and so audio was used in the end.  The patient was located at home.  I was located in my office.  The patient did consent to this  virtual visit and is aware of possible charges through their insurance for this visit.  The patient is an established patient.  My certified medical assistant, KelGrace Bushyontributed to this visit by contacting the patient by phone 1 or 2 business days prior to the appointment and also followed up on the recommendations I made after the visit.  Time spent on virtual visit: 22 minutes   HPI: This is a very pleasant [age] man.  He is on 100m30mathiaprine once daily.  He has not heard about entyvio appointment yet.  He is still on prednisone 10mg26me daily.  He feels OK but still notices some periodic bleeding, periodic abdominaldiscomforts.  HAving solid BMs, a bit frequent.   Chief complaint is UC  ROS: complete GI ROS as described in HPI, all other review negative.  Constitutional:  No unintentional weight loss   Past Medical History:  Diagnosis Date  . Clostridioides difficile infection 05/2018  . Eczema   . Ulcerative colitis (H[facility] Past Surgical History:  Procedure Laterality Date  . BIOPSY  07/27/2018   Procedure: BIOPSY;  Surgeon: JacobMilus Banister  [facility];  Service: Gastroenterology;;  . COLONOSCOPY W/ BIOPSIES    . COLONOSCOPY WITH PROPOFOL N/A 07/27/2018   Procedure: COLONOSCOPY WITH PROPOFOL;  Surgeon: JacobMilus Banister  [facility];  Service: Gastroenterology;  Laterality: N/A;  . MOUTH SURGERY      Current Outpatient Medications  Medication Sig Dispense Refill  . azathioprine (IMURAN) 100 MG tablet Take 1 tablet (100 mg total) by mouth daily. 30 tablet 6  . betamethasone dipropionate (DIPROLENE) 0.05 % cream Apply topically 2 (two) times daily. 60 g 1  . fluticasone (FLONASE) 50 MCG/ACT nasal spray Place 1 spray into both  nostrils 2 (two) times daily as needed for allergies or rhinitis. 16 g 6  . inFLIXimab (REMICADE IV) Inject into the vein every 8 (eight) weeks.    . Multiple Vitamin (MULTIVITAMIN WITH MINERALS) TABS  tablet Take 1 tablet by mouth daily.    . predniSONE (DELTASONE) 10 MG tablet Prednisone 40 mg daily decrease by 10 mg every week till 10 mg daily till done 60 tablet 0  . saccharomyces boulardii (FLORASTOR) 250 MG capsule Take 1 capsule (250 mg total) by mouth 2 (two) times daily. 60 capsule 0  . triamcinolone cream (KENALOG) 0.1 % Apply 1 application topically daily as needed (for irritation).   1   No current facility-administered medications for this visit.     Allergies as of 10/16/2018 - Review Complete 09/13/2018  Allergen Reaction Noted  . Eggs or egg-derived products Anaphylaxis 09/19/2012  . Peanut butter flavor Anaphylaxis 09/19/2012  . Peanut-containing drug products Anaphylaxis 09/19/2012    Family History  Problem Relation Age of Onset  . Seizures Mother   . Bipolar disorder Mother   . Hyperlipidemia Father   . Bipolar disorder Brother   . Anxiety disorder Brother   . Colon cancer Maternal Grandmother   . Stroke Maternal Grandmother   . Liver cancer Maternal Grandfather   . Esophageal cancer Neg Hx     Social History   Socioeconomic History  . Marital status: Single    Spouse name: Not on file  . Number of children: 0  . Years of education: Not on file  . Highest education level: Not on file  Occupational History  . Occupation: Actuary    Comment: in [place]  . Financial resource strain: Not on file  . Food insecurity:    Worry: Not on file    Inability: Not on file  . Transportation needs:    Medical: Not on file    Non-medical: Not on file  Tobacco Use  . Smoking status: Never Smoker  . Smokeless tobacco: Never Used  Substance and Sexual Activity  . Alcohol use: No  . Drug use: No  . Sexual activity: Not Currently  Lifestyle  . Physical activity:    Days per week: Not on file    Minutes per session: Not on file  . Stress: Not on file  Relationships  . Social connections:    Talks on phone: Not on file    Gets together:  Not on file    Attends religious service: Not on file    Active member of club or organization: Not on file    Attends meetings of clubs or organizations: Not on file    Relationship status: Not on file  . Intimate partner violence:    Fear of current or ex partner: Not on file    Emotionally abused: Not on file    Physically abused: Not on file    Forced sexual activity: Not on file  Other Topics Concern  . Not on file  Social History Narrative  . Not on file     Physical Exam: Unable to perform because this was a "telemed visit" due to current Covid-19 pandemic  Assessment and plan: 23 y.o. male with severe UC  I am not sure why he has not restarted entyvio yet. WE will look into that with goal of ASAP start. He will stay on prednisone without further tapering (49m daily currently) and also will stay on azathiaprine at 1075mdaily.  Needs cbc, cmet  this week.  ROV with me in 3-4 weeks, hopefully in person.  Please see the "Patient Instructions" section for addition details about the plan.  [name], MD [facility] 10/16/2018, 7:55 AM

## 2018-10-17 LAB — COMPREHENSIVE METABOLIC PANEL
ALT: 12 U/L (ref 0–53)
AST: 15 U/L (ref 0–37)
Albumin: 3.9 g/dL (ref 3.5–5.2)
Alkaline Phosphatase: 40 U/L (ref 39–117)
BUN: 15 mg/dL (ref 6–23)
CO2: 34 mEq/L — ABNORMAL HIGH (ref 19–32)
Calcium: 8.9 mg/dL (ref 8.4–10.5)
Chloride: 98 mEq/L (ref 96–112)
Creatinine, Ser: 0.87 mg/dL (ref 0.40–1.50)
GFR: 106.67 mL/min (ref >60.00)
Glucose, Bld: 83 mg/dL (ref 70–99)
Potassium: 4 mEq/L (ref 3.5–5.1)
Sodium: 137 mEq/L (ref 135–145)
Total Bilirubin: 0.4 mg/dL (ref 0.2–1.2)
Total Protein: 6.8 g/dL (ref 6.0–8.3)

## 2018-10-24 ENCOUNTER — Other Ambulatory Visit: Payer: Self-pay | Admitting: Gastroenterology

## 2018-10-24 ENCOUNTER — Telehealth: Payer: Self-pay | Admitting: Gastroenterology

## 2018-10-24 MED ORDER — PREDNISONE 10 MG PO TABS: 10.0000 mg | ORAL_TABLET | Freq: Every day | ORAL | 3 refills | Status: DC

## 2018-10-24 NOTE — Telephone Encounter (Signed)
Spoke to patient to let him know that a prescription has been sent to his pharmacy for prednisone 10 mg. He starts his Entyvio infusion on 10/24/18. We discussed that there was a delay in starting his Entyvio,and that may be the reason for his mild symptoms. I asked patient to call our office if symptoms do not improve after his infusion. Patient voiced understanding.

## 2018-10-24 NOTE — Telephone Encounter (Signed)
Pt calling for a prescription of prednisone. He also wanted to let Dr. Ardis Hughs know that since yesterday he has been feeling mild discomfort and bleeding.

## 2018-10-25 DIAGNOSIS — K5190 Ulcerative colitis, unspecified, without complications: Secondary | ICD-10-CM | POA: Diagnosis not present

## 2018-11-02 ENCOUNTER — Telehealth: Payer: Self-pay | Admitting: Gastroenterology

## 2018-11-02 NOTE — Telephone Encounter (Signed)
The pt was advised to call his PCP for HA and BP/pulse rate.  The pt has been advised of the information and verbalized understanding and agreed.

## 2018-11-03 ENCOUNTER — Encounter: Payer: Self-pay | Admitting: General Surgery

## 2018-11-03 NOTE — Telephone Encounter (Signed)
The pt called yesterday with HA and increased pulse rate.  He was advised to call his PCP.  He has returned a call today stating that the pharmacy told him it could be the prednisone he is on.  He is currently on 10 mg of prednisone daily.  He has a telehealth visit on Monday.

## 2018-11-03 NOTE — Telephone Encounter (Signed)
Patient called and wanted to leave a msg for the nurse to call him back. He wanted me to advised that last night the head ache was so bad that it prevented him from sleep

## 2018-11-06 ENCOUNTER — Encounter: Payer: Self-pay | Admitting: Gastroenterology

## 2018-11-06 ENCOUNTER — Ambulatory Visit (INDEPENDENT_AMBULATORY_CARE_PROVIDER_SITE_OTHER): Payer: BC Managed Care – PPO | Admitting: Gastroenterology

## 2018-11-06 ENCOUNTER — Other Ambulatory Visit: Payer: Self-pay

## 2018-11-06 VITALS — Ht 65.0 in | Wt 119.0 lb

## 2018-11-06 DIAGNOSIS — K5100 Ulcerative (chronic) pancolitis without complications: Secondary | ICD-10-CM

## 2018-11-06 NOTE — Progress Notes (Signed)
Review of pertinent gastrointestinal problems: 1.Extensive, severeUC:Presented with abdominal pain, bloody diarrhea February 2019. CT scan showed pan colonic inflammation. Colonoscopy, Dr. Ardis Hughs April 2019 showed moderate to severe inflammation throughout his colon. Biopsies from this confirmed inflammatory bowel disease without granulomas. There was no rectal sparing. The terminal ileum was normal. He responded to prednisone and was started on oral full strength mesalamine.10/2017 he was clearly steroid dependent (could not wean off steroids without colitis symptoms recurring) and so decided to start entyvio  10/2017 labs: Hepatitis B surface antigen and antibody negative, Hepatitis C Ab negative, HIV negative, TB quant gold negative,TPMT normal metabolizer.  11/2017 Entyvio new start.  01/2018 seemingly non-responder to entyvio (sed rate 52, Gi path panel negative, clinically unchanged), so changed to remicade 72m/kg  Colonoscopy February 2020 showed normal terminal ileum, severe inflammation from anus to cecum. Multiple biopsies showed inflammation, no sign of pseudomembranes, no sign of viral infection.  March 2020 Remicade level undetectable, positive Remicade antibodies have developed(ADA 24AU)  4/20: entyvio antibodies negative; restarted entyvio (09/2018) at usual load protocol, also added azathiaprine 259mkg 2. Acute C. Diff+ diarrhea,PCR confirmed 05/2017, his likely he caught this from his brother who had c. Diff a few weeks prior. Started on vanc 12586mO QID for 2 weeks.The C. difficile seem to significantly flare his ulcerative colitis,seecolonoscopy above February 2020 while he was admitted to the hospital for stabilization, hydration.   This service was provided via virtual visit.   Only audio was used.  The patient was located at home.  I was located in my office.  The patient did consent to this virtual visit and is aware of possible charges through their insurance  for this visit.  The patient is an established patient.  My certified medical assistant, KelGrace Bushyontributed to this visit by contacting the patient by phone 1 or 2 business days prior to the appointment and also followed up on the recommendations I made after the visit.  Time spent on virtual visit: 22 minutes   HPI: This is a very pleasant 23 12ar old man whom I last saw via telemedicine visit about 3 or 4 weeks ago.  Since then he has started his Entyvio load.  His second infusion is on Wednesday, 2 days from now.  He has noticed a dull headache for several days, this can interrupt his sleep.  He does not recall having headaches at all the first time he tried EntFijiHis bowels have been generally mushy consistency, never liquid fortunately.  He does see blood periodically still.  No significant abdominal pains.  He is on prednisone 76m71mlls once daily.  Azathiaprine 100mg41me daily.  Entyvio loading ongoing   Chief complaint is severe pancolitis  ROS: complete GI ROS as described in HPI, all other review negative.  Constitutional:  No unintentional weight loss   Past Medical History:  Diagnosis Date  . Clostridioides difficile infection 05/2018  . Eczema   . Ulcerative colitis (HCC)[facility] Past Surgical History:  Procedure Laterality Date  . BIOPSY  07/27/2018   Procedure: BIOPSY;  Surgeon: JacobMilus Banister  [facility];  Service: Gastroenterology;;  . COLONOSCOPY W/ BIOPSIES    . COLONOSCOPY WITH PROPOFOL N/A 07/27/2018   Procedure: COLONOSCOPY WITH PROPOFOL;  Surgeon: JacobMilus Banister  [facility];  Service: Gastroenterology;  Laterality: N/A;  . MOUTH SURGERY      Current Outpatient Medications  Medication Sig Dispense Refill  . azathioprine (IMURAN) 100 MG  tablet Take 1 tablet (100 mg total) by mouth daily. 30 tablet 6  . betamethasone dipropionate (DIPROLENE) 0.05 % cream Apply topically 2 (two) times daily. 60 g 1  .  fluticasone (FLONASE) 50 MCG/ACT nasal spray Place 1 spray into both nostrils 2 (two) times daily as needed for allergies or rhinitis. 16 g 6  . Multiple Vitamin (MULTIVITAMIN WITH MINERALS) TABS tablet Take 1 tablet by mouth daily.    . predniSONE (DELTASONE) 10 MG tablet Prednisone 40 mg daily decrease by 10 mg every week till 10 mg daily till done 60 tablet 0  . predniSONE (DELTASONE) 10 MG tablet Take 1 tablet (10 mg total) by mouth daily with breakfast. 30 tablet 3  . saccharomyces boulardii (FLORASTOR) 250 MG capsule Take 1 capsule (250 mg total) by mouth 2 (two) times daily. 60 capsule 0  . triamcinolone cream (KENALOG) 0.1 % Apply 1 application topically daily as needed (for irritation).   1  . vedolizumab (ENTYVIO) 300 MG injection Inject 300 mg into the vein every 8 (eight) weeks.     No current facility-administered medications for this visit.     Allergies as of 11/06/2018 - Review Complete 11/06/2018  Allergen Reaction Noted  . Eggs or egg-derived products Anaphylaxis 09/19/2012  . Peanut butter flavor Anaphylaxis 09/19/2012  . Peanut-containing drug products Anaphylaxis 09/19/2012    Family History  Problem Relation Age of Onset  . Seizures Mother   . Bipolar disorder Mother   . Hyperlipidemia Father   . Bipolar disorder Brother   . Anxiety disorder Brother   . Colon cancer Maternal Grandmother   . Stroke Maternal Grandmother   . Liver cancer Maternal Grandfather   . Esophageal cancer Neg Hx     Social History   Socioeconomic History  . Marital status: Single    Spouse name: Not on file  . Number of children: 0  . Years of education: Not on file  . Highest education level: Not on file  Occupational History  . Occupation: Actuary    Comment: in [place]  . Financial resource strain: Not on file  . Food insecurity:    Worry: Not on file    Inability: Not on file  . Transportation needs:    Medical: Not on file    Non-medical: Not on  file  Tobacco Use  . Smoking status: Never Smoker  . Smokeless tobacco: Never Used  Substance and Sexual Activity  . Alcohol use: No  . Drug use: No  . Sexual activity: Not Currently  Lifestyle  . Physical activity:    Days per week: Not on file    Minutes per session: Not on file  . Stress: Not on file  Relationships  . Social connections:    Talks on phone: Not on file    Gets together: Not on file    Attends religious service: Not on file    Active member of club or organization: Not on file    Attends meetings of clubs or organizations: Not on file    Relationship status: Not on file  . Intimate partner violence:    Fear of current or ex partner: Not on file    Emotionally abused: Not on file    Physically abused: Not on file    Forced sexual activity: Not on file  Other Topics Concern  . Not on file  Social History Narrative  . Not on file     Physical Exam: Unable to  perform because this was a "telemed visit" due to current Covid-19 pandemic  Assessment and plan: 23 y.o. male with extensive, severe ulcerative colitis  He has noticed a dull headache over the past several days.  Weyman Rodney can certainly cause headaches but he never noticed this the first time he tried the medicine in 2019.  I hope it will not be a limiting side effect going forward.  I encouraged him to try staying as hydrated as possible as this might contribute especially in the hot humid weather we have been having.  He will continue Entyvio load, his second infusion is in 2 days from now.  He will cut his prednisone from 10 mg to 5 mg today and then stop the prednisone completely in 2 weeks from now.  He will continue azathioprine 100 mg daily.  Return office visit with me, hopefully in person in about 5 or 6 weeks.  He knows to call here sooner if he has any serious questions or concerns.  Please see the "Patient Instructions" section for addition details about the plan.  [name], MD [facility] 11/06/2018, 11:10 AM

## 2018-11-06 NOTE — Patient Instructions (Addendum)
He knows to decrease his prednisone to 5 mg daily for now and then stop the prednisone completely in 2 weeks from now  He will continue taking azathioprine 100 mg once daily.  He will continue with his Entyvio loading, second infusion is in 2 days from now and so the third loading infusion will be in about 1 month.  He knows to try to stay hydrated, this might help his headaches a bit  He will return to see me in mid July hopefully in person, knows to call sooner if he has any concerns  Thank you for entrusting me with your care and choosing [facility].  Dr Ardis Hughs

## 2018-11-08 DIAGNOSIS — K5190 Ulcerative colitis, unspecified, without complications: Secondary | ICD-10-CM | POA: Diagnosis not present

## 2018-11-09 ENCOUNTER — Telehealth: Payer: Self-pay | Admitting: Gastroenterology

## 2018-11-09 NOTE — Telephone Encounter (Signed)
Patient wanted to let the docotor know that since the cut back of the predniSONE (DELTASONE) 10 MG tablet [[id number]]   he has notice drastic negative changes and need to speak with nurse.

## 2018-11-09 NOTE — Telephone Encounter (Signed)
Patient reports that since the prednisone decrease to 5 mg. He is now having excess gas, loose stool, bleeding, and thirsty today.  He is asking if he should return to 10 mg

## 2018-11-10 NOTE — Telephone Encounter (Signed)
Patient notified and he verbalized understanding.

## 2018-11-10 NOTE — Telephone Encounter (Signed)
Yes, thanks.  He should go back to 15m daily for the next 3 weeks, then try again to taper.  That may give entyvio more time to take affect.

## 2018-12-05 ENCOUNTER — Telehealth: Payer: Self-pay | Admitting: Gastroenterology

## 2018-12-05 DIAGNOSIS — K51919 Ulcerative colitis, unspecified with unspecified complications: Secondary | ICD-10-CM

## 2018-12-05 NOTE — Telephone Encounter (Signed)
Attempted to return pts call and received message that voicemail box is full and cannot accept messages at this time.

## 2018-12-05 NOTE — Telephone Encounter (Signed)
Patient called in wanting some advice from the nurse. He stated that he started the lower dosage of predniSONE (DELTASONE) 10 MG tablet [[id number]]  37m on 12/01/2018 and since then he has started bleeding again,having some bowl discomfort,and he is wondering if maybe the Entyvio needs adjusting?? Please call and advise thanks.

## 2018-12-06 DIAGNOSIS — K5190 Ulcerative colitis, unspecified, without complications: Secondary | ICD-10-CM | POA: Diagnosis not present

## 2018-12-11 NOTE — Telephone Encounter (Signed)
Pt states he is currently taking prednisone 9m daily but he is continuing to have abdominal pain/discomfort. Reports he is still seeing BRB in stool on occasion and sometimes bloody mucous. His Entyvio infusion was on 12/06/18. Pt wants to know if he needs to make any changes, please advise.  Also states his work accomodation note may need to be adjusted because it has him returning to work 8/1 and some of his MD appts are not until 8/3.

## 2018-12-15 NOTE — Telephone Encounter (Signed)
Okay, I would like him to go back up to 10 mg of prednisone every single day and I would like him to have an Entyvio TROUGH drug level checked the day prior to his next infusion.  Tell him to call if he does not see improvement on the 10 mg of prednisone every day after 7 to 10 days.

## 2018-12-15 NOTE — Telephone Encounter (Signed)
The pt has been advised and agrees to increase prednisone to 10 mg and also will have labs 1 day prior to next infusion.  He will call in 10 days if no better.

## 2018-12-20 ENCOUNTER — Telehealth: Payer: Self-pay | Admitting: Gastroenterology

## 2018-12-20 NOTE — Telephone Encounter (Signed)
The pt would like a work note to remain out of work until atleast 8/3.  He says with his immune system being down he does not think he should be working with Covid-19 being a problem.  He also says that he does not have another MD appt until 8-3.  Please advise (he is aware that Dr Ardis Hughs is out of the office until 8/27.

## 2018-12-24 NOTE — Telephone Encounter (Signed)
I think that is all perfectly reasonable.  Can you go ahead and write that work note.  Thanks

## 2018-12-25 NOTE — Telephone Encounter (Signed)
Form printed and given to Dr Ardis Hughs to add date for return to work.

## 2018-12-25 NOTE — Telephone Encounter (Signed)
The pt asked that the original paper work that was sent him from his employer be re faxed stating he is out of work until TBD.  I have refaxed that paperwork and he will call if this is not sufficient.

## 2018-12-25 NOTE — Telephone Encounter (Signed)
Pt stated that he had just contacted the ADA paperwork people--they requested a specified range when the pt may go back to work.  They requested that area marked "unspecified date" should be struck through and initialed with an estimated date.

## 2019-01-01 ENCOUNTER — Telehealth: Payer: Self-pay | Admitting: Gastroenterology

## 2019-01-01 DIAGNOSIS — Z79899 Other long term (current) drug therapy: Secondary | ICD-10-CM | POA: Diagnosis not present

## 2019-01-01 DIAGNOSIS — K5190 Ulcerative colitis, unspecified, without complications: Secondary | ICD-10-CM | POA: Diagnosis not present

## 2019-01-01 NOTE — Telephone Encounter (Signed)
Pt requested a call back regarding blood works--he stated that this is urgent.

## 2019-01-01 NOTE — Telephone Encounter (Signed)
Covid-19 screening questions   Do you now or have you had a fever in the last 14 days? NO   Do you have any respiratory symptoms of shortness of breath or cough now or in the last 14 days? NO   Do you have any family members or close contacts with diagnosed or suspected Covid-19 in the past 14 days? NO   Have you been tested for Covid-19 and found to be positive? NO

## 2019-01-01 NOTE — Telephone Encounter (Signed)
The pt has questions about not waiting until the day prior for next trough level.  He says he continues to have abd soreness and some rectal bleeding.  Appt made for pt to come in on 8/4 (tomorrow) to discuss symptoms and labs

## 2019-01-02 ENCOUNTER — Other Ambulatory Visit (INDEPENDENT_AMBULATORY_CARE_PROVIDER_SITE_OTHER): Payer: BC Managed Care – PPO

## 2019-01-02 ENCOUNTER — Other Ambulatory Visit: Payer: Self-pay | Admitting: Gastroenterology

## 2019-01-02 ENCOUNTER — Ambulatory Visit (INDEPENDENT_AMBULATORY_CARE_PROVIDER_SITE_OTHER): Payer: BC Managed Care – PPO | Admitting: Gastroenterology

## 2019-01-02 ENCOUNTER — Encounter: Payer: Self-pay | Admitting: Gastroenterology

## 2019-01-02 VITALS — BP 110/72 | HR 104 | Temp 97.8°F | Ht 65.5 in | Wt 131.5 lb

## 2019-01-02 DIAGNOSIS — K5100 Ulcerative (chronic) pancolitis without complications: Secondary | ICD-10-CM

## 2019-01-02 LAB — CBC WITH DIFFERENTIAL/PLATELET
Basophils Absolute: 0 10*3/uL (ref 0.0–0.1)
Basophils Relative: 0.3 % (ref 0.0–3.0)
Eosinophils Absolute: 0.4 10*3/uL (ref 0.0–0.7)
Eosinophils Relative: 7.3 % — ABNORMAL HIGH (ref 0.0–5.0)
HCT: 42.7 % (ref 39.0–52.0)
Hemoglobin: 14.4 g/dL (ref 13.0–17.0)
Lymphocytes Relative: 39 % (ref 12.0–46.0)
Lymphs Abs: 2.3 10*3/uL (ref 0.7–4.0)
MCHC: 33.6 g/dL (ref 30.0–36.0)
MCV: 91 fl (ref 78.0–100.0)
Monocytes Absolute: 0.7 10*3/uL (ref 0.1–1.0)
Monocytes Relative: 11.3 % (ref 3.0–12.0)
Neutro Abs: 2.5 10*3/uL (ref 1.4–7.7)
Neutrophils Relative %: 42.1 % — ABNORMAL LOW (ref 43.0–77.0)
Platelets: 203 10*3/uL (ref 150.0–400.0)
RBC: 4.69 Mil/uL (ref 4.22–5.81)
RDW: 15.4 % (ref 11.5–15.5)
WBC: 5.9 10*3/uL (ref 4.0–10.5)

## 2019-01-02 LAB — SEDIMENTATION RATE: Sed Rate: 14 mm/hr (ref 0–15)

## 2019-01-02 MED ORDER — AZATHIOPRINE 50 MG PO TABS: ORAL_TABLET | ORAL | 4 refills | Status: DC

## 2019-01-02 NOTE — Patient Instructions (Addendum)
Labs today including CBC, complete metabolic profile and sedimentation rate, GI pathogen panel   New prescription for higher dose of azathioprine.  The new prescription will be for azathioprine 125 mg once daily, dispense 1 month with 4 refills   Please increase your prednisone to 15 mg once daily for the next 2 weeks and then start to decrease it to 5 5 mg/week again   Continue with plans for your Entyvio next dose with a trough level and antibodies checked the day prior  Return office visit with Dr. Ardis Hughs in 6 weeks  Thank you for entrusting me with your care and choosing [facility].  Dr Ardis Hughs

## 2019-01-02 NOTE — Progress Notes (Signed)
Review of pertinent gastrointestinal problems: 1.Extensive, severeUC:Presented with abdominal pain, bloody diarrhea February 2019. CT scan showed pan colonic inflammation. Colonoscopy, Dr. Ardis [facility]ardenas April 2019 showed moderate to severe inflammation throughout his colon. Biopsies from this confirmed inflammatory bowel disease without granulomas. There was no rectal sparing. The terminal ileum was normal. He responded to prednisone and was started on oral full strength mesalamine.10/2017 he was clearly steroid dependent (could not wean off steroids without colitis symptoms recurring) and so decided to start entyvio  10/2017 labs: Hepatitis B surface antigen and antibody negative, Hepatitis C Ab negative, HIV negative, TB quant gold negative,TPMT normal metabolizer.  11/2017 Entyvio new start.  01/2018 seemingly non-responder to entyvio (sed rate 52, Gi path panel negative, clinically unchanged), so changed to remicade 67m/kg  Colonoscopy February 2020 showed normal terminal ileum, severe inflammation from anus to cecum. Multiple biopsies showed inflammation, no sign of pseudomembranes, no sign of viral infection.  March 2020 Remicade level undetectable, positive Remicade antibodies have developed(ADA 24AU)  4/20: entyvio antibodies negative; restarted entyvio (09/2018) at usual load protocol, also added azathiaprine 268mkg 2. Acute C. Diff+ diarrhea,PCR confirmed 05/2017, his likely he caught this from his brother who had c. Diff a few weeks prior. Started on vanc 12519mO QID for 2 weeks.The C. difficile seem to significantly flare his ulcerative colitis,seecolonoscopy above February 2020 while he was admitted to the hospital for stabilization, hydration.  HPI: This is a very pleasant [age]ar old man whom I last saw about 2 months ago here in the office.  He is with his mother today.  I last saw him here in the office about 2 months ago.  He was on prednisone 10 mg once daily.  He was on  azathioprine 100 mg once daily (approximately 2 mg/kg dosing).  He had started Entyvio loading at usual doses.  He was actually due for his second dose 2 days after that office visit.  He has tried tapering from the 10 mg prednisone twice since then and each time he tapers down to 5 mg a day he starts to have a flare of symptoms.  Specifically tells me that as he tapers down from 10 to 5 mg he will have softer stools more bleeding more urgency and some crampy abdominal pains.  He tends to go 6-8 times a day.  He does not have any nocturnal symptoms.  He believes his next Entyvio dose is September 2  Chief complaint is ulcerative pancolitis  ROS: complete GI ROS as described in HPI, all other review negative.  Constitutional:  No unintentional weight loss   Past Medical History:  Diagnosis Date  . Clostridioides difficile infection 05/2018  . Eczema   . [facility]   Past Surgical History:  Procedure Laterality Date  . BIOPSY  07/27/2018   Procedure: BIOPSY;  Surgeon: [facility];  [facility]: [facility];  Service: Gastroenterology;;  . COLONOSCOPY W/ BIOPSIES    . COLONOSCOPY WITH PROPOFOL N/A 07/27/2018   Procedure: COLONOSCOPY WITH PROPOFOL;  Surgeon: [facility];  [facility];  Service: Gastroenterology;  Laterality: N/A;  . MOUTH SURGERY      Current Outpatient Medications  Medication Sig Dispense Refill  . azathioprine (IMURAN) 100 MG tablet Take 1 tablet (100 mg total) by mouth daily. 30 tablet 6  . betamethasone dipropionate (DIPROLENE) 0.05 % cream Apply topically 2 (two) times daily. 60 g 1  . fluticasone (FLONASE) 50 MCG/ACT nasal spray Place 1 spray into both nostrils  2 (two) times daily as needed for allergies or rhinitis. 16 g 6  . Multiple Vitamin (MULTIVITAMIN WITH MINERALS) TABS tablet Take 1 tablet by mouth daily.    . predniSONE (DELTASONE) 10 MG tablet Take 1 tablet (10 mg total) by mouth daily with breakfast. 30 tablet 3   . saccharomyces boulardii (FLORASTOR) 250 MG capsule Take 1 capsule (250 mg total) by mouth 2 (two) times daily. (Patient taking differently: Take 250 mg by mouth daily. ) 60 capsule 0  . triamcinolone cream (KENALOG) 0.1 % Apply 1 application topically daily as needed (for irritation).   1  . vedolizumab (ENTYVIO) 300 MG injection Inject 300 mg into the vein every 8 (eight) weeks.    Marland Kitchen azaTHIOprine (IMURAN) 50 MG tablet Take 2 1/2 tabs to equal 120m daily 90 tablet 4   No current facility-administered medications for this visit.     Allergies as of 01/02/2019 - Review Complete 01/02/2019  Allergen Reaction Noted  . Eggs or egg-derived products Anaphylaxis 09/19/2012  . Peanut butter flavor Anaphylaxis 09/19/2012  . Peanut-containing drug products Anaphylaxis 09/19/2012    Family History  Problem Relation Age of Onset  . Seizures Mother   . Bipolar disorder Mother   . Hyperlipidemia Father   . Bipolar disorder Brother   . Anxiety disorder Brother   . Colon cancer Maternal Grandmother   . Stroke Maternal Grandmother   . Liver cancer Maternal Grandfather   . Esophageal cancer Neg Hx     Social History   Socioeconomic History  . Marital status: Single    Spouse name: Not on file  . Number of children: 0  . Years of education: Not on file  . Highest education level: Not on file  Occupational History  . Occupation: TActuary   Comment: in [place] . Financial resource strain: Not on file  . Food insecurity    Worry: Not on file    Inability: Not on file  . Transportation needs    Medical: Not on file    Non-medical: Not on file  Tobacco Use  . Smoking status: Never Smoker  . Smokeless tobacco: Never Used  Substance and Sexual Activity  . Alcohol use: No  . Drug use: No  . Sexual activity: Not Currently  Lifestyle  . Physical activity    Days per week: Not on file    Minutes per session: Not on file  . Stress: Not on file  Relationships  .  Social c[place]bal[place] phone: Not on file    Gets together: Not on file    Attends religious service: Not on file    Active member of club or organization: Not on file    Attends meetings of clubs or organizations: Not on file    Relationship status: Not on file  . Intimate partner violence    Fear of current or ex partner: Not on file    Emotionally abused: Not on file    Physically abused: Not on file    Forced sexual activity: Not on file  Other Topics Concern  . Not on file  Social History Narrative  . Not on file     Physical Exam: BP 110/72 (BP Location: Left Arm, Patient Position: Sitting, Cuff Size: Normal)   Pulse (!) 104   Temp 97.8 F (36.6 C)   Ht 5' 5.5" (1.664 m) Comment: height measured without shoes  Wt 131 lb 8 oz (59.6 kg)  BMI 21.55 kg/m  Constitutional: generally well-appearing Psychiatric: alert and oriented x3 Abdomen: soft, nontender, nondistended, no obvious ascites, no peritoneal signs, normal bowel sounds No peripheral edema noted in lower extremities  Assessment and plan: 23 y.o. male with extensive ulcerative colitis, severe  I am truly having trouble getting him back into remission.  We will be checking Entyvio antibodies and trough level at his next 8-week dose which should be in about a month from now.  He is going to have labs checked today including a CBC, complete metabolic profile, sed rate and GI pathogen panel.  Of asked him to increase his prednisone to 15 mg daily for now.  We still have a bit of room on his azathioprine dose and so I am going to have increased from 100 mg daily to 125 mg daily which is 2.5 mg/kg daily.  He is going to return to see me in 6 weeks.  If he is still not remitting and if there is no obvious explanation based on his Entyvio drug levels, antibody levels then I might have him meet with 1 of my partners who has a bit more expertise in difficult IBD situation such as habits.  Please see the "Patient  Instructions" section for addition details about the plan.  [name], MD [facility] 01/02/2019, 1:29 PM

## 2019-01-03 LAB — GASTROINTESTINAL PATHOGEN PANEL PCR
C. difficile Tox A/B, PCR: NOT DETECTED
Campylobacter, PCR: NOT DETECTED
Cryptosporidium, PCR: NOT DETECTED
E coli (ETEC) LT/ST PCR: NOT DETECTED
E coli (STEC) stx1/stx2, PCR: NOT DETECTED
E coli 0157, PCR: NOT DETECTED
Giardia lamblia, PCR: NOT DETECTED
Norovirus, PCR: NOT DETECTED
Rotavirus A, PCR: NOT DETECTED
Salmonella, PCR: NOT DETECTED
Shigella, PCR: NOT DETECTED

## 2019-01-24 DIAGNOSIS — L219 Seborrheic dermatitis, unspecified: Secondary | ICD-10-CM | POA: Diagnosis not present

## 2019-01-24 DIAGNOSIS — B360 Pityriasis versicolor: Secondary | ICD-10-CM | POA: Diagnosis not present

## 2019-01-24 DIAGNOSIS — L853 Xerosis cutis: Secondary | ICD-10-CM | POA: Diagnosis not present

## 2019-01-30 ENCOUNTER — Other Ambulatory Visit: Payer: BC Managed Care – PPO

## 2019-01-30 DIAGNOSIS — K5100 Ulcerative (chronic) pancolitis without complications: Secondary | ICD-10-CM | POA: Diagnosis not present

## 2019-01-31 DIAGNOSIS — Z79899 Other long term (current) drug therapy: Secondary | ICD-10-CM | POA: Diagnosis not present

## 2019-01-31 DIAGNOSIS — K5190 Ulcerative colitis, unspecified, without complications: Secondary | ICD-10-CM | POA: Diagnosis not present

## 2019-02-12 ENCOUNTER — Other Ambulatory Visit: Payer: Self-pay

## 2019-02-12 ENCOUNTER — Ambulatory Visit: Payer: BC Managed Care – PPO | Admitting: Gastroenterology

## 2019-02-12 ENCOUNTER — Encounter: Payer: Self-pay | Admitting: Gastroenterology

## 2019-02-12 VITALS — BP 100/60 | HR 110 | Temp 98.4°F | Ht 65.5 in | Wt 138.0 lb

## 2019-02-12 DIAGNOSIS — K5100 Ulcerative (chronic) pancolitis without complications: Secondary | ICD-10-CM | POA: Diagnosis not present

## 2019-02-12 NOTE — Patient Instructions (Signed)
We have scheduled you for a follow up appointment with Dr Ardis Hughs on 03/13/19 at 9:10am   Thank you for entrusting me with your care and choosing [facility].  Dr Ardis Hughs

## 2019-02-12 NOTE — Progress Notes (Signed)
Review of pertinent gastrointestinal problems: 1.Extensive, severeUC:Presented with abdominal pain, bloody diarrhea February 2019. CT scan showed pan colonic inflammation. Colonoscopy, Dr. Ardis Hughs April 2019 showed moderate to severe inflammation throughout his colon. Biopsies from this confirmed inflammatory bowel disease without granulomas. There was no rectal sparing. The terminal ileum was normal. He responded to prednisone and was started on oral full strength mesalamine.10/2017 he was clearly steroid dependent (could not wean off steroids without colitis symptoms recurring) and so decided to start entyvio  10/2017 labs: Hepatitis B surface antigen and antibody negative, Hepatitis C Ab negative, HIV negative, TB quant gold negative,TPMT normal metabolizer.  11/2017 Entyvio new start.  01/2018 seemingly non-responder to entyvio (sed rate 52, Gi path panel negative, clinically unchanged), so changed to remicade 85m/kg  Colonoscopy February 2020 showed normal terminal ileum, severe inflammation from anus to cecum. Multiple biopsies showed inflammation, no sign of pseudomembranes, no sign of viral infection.  March 2020 Remicade level undetectable, positive Remicade antibodies have developed(ADA 24AU)  4/20: entyvio antibodies negative; restarted entyvio(09/2018)at usual load protocol, also added azathiaprine 233mkg 2. Acute C. Diff+ diarrhea,PCR confirmed 05/2017, his likely he caught this from his brother who had c. Diff a few weeks prior. Started on vanc 12573mO QID for 2 weeks.The C. difficile seem to cause a significant flare of his ulcerative colitis,seecolonoscopy above February 2020 while he was admitted to the hospital for stabilization, hydration.    HPI: This is a very pleasant 23 63ar old man who is here with his mother again today.  I last saw him about a month ago  Chief complaint is ulcerative colitis  I Last saw him about a month ago.  It was clear that I was  having trouble getting him back into remission after C. difficile infection.  I increased his azathioprine from 100 mg daily up to 125 mg daily which is 2.5 mg/kg daily now.  I increased his prednisone back to 15 mg daily.   Lab testing showed his sed rate was actually normal at 14, CBC was normal.  GI pathogen showed no obvious infections.  I obtained a trough Entyvio level about 2 weeks ago and it was 12 mcg/mL.  This is lower than the greater than 14 mcg/mL target that has been proposed.  Entyvio antibodies not yet reported.  His weight is up 7 pounds since his office visit 6 weeks ago.  He has truly been taking his prednisone 50 mg once daily.  He is also been taking his azathioprine 125 mg once daily.  He started Florastor twice daily.  He is having solid but urgent stools that are intermittently bloody.  He will go 5-8 times per day.  He is not having nocturnal symptoms.  He does have a periodic cramping  ROS: complete GI ROS as described in HPI, all other review negative.  Constitutional:  No unintentional weight loss   Past Medical History:  Diagnosis Date  . Clostridioides difficile infection 05/2018  . Eczema   . [facility] [place]   Past Surgical History:  Procedure Laterality Date  . BIOPSY  07/27/2018   Procedure: BIOPSY;  Surgeon: JacMilus BanisterD;  [facility];  Service: Gastroenterology;;  . COLONOSCOPY W/ BIOPSIES    . COLONOSCOPY WITH PROPOFOL N/A 07/27/2018   Procedure: COLONOSCOPY WITH PROPOFOL;  Surgeon: JacMilus BanisterD;  Location: [facility];  Service: Gastroenterology;  Laterality: N/A;  . MOUTH SURGERY      Current Outpatient Medications  Medication Sig Dispense Refill  .  azathioprine (IMURAN) 100 MG tablet Take 1 tablet (100 mg total) by mouth daily. 30 tablet 6  . azaTHIOprine (IMURAN) 50 MG tablet Take 2 1/2 tabs to equal 138m daily 90 tablet 4  . betamethasone dipropionate (DIPROLENE) 0.05 % cream Apply topically 2 (two) times  daily. 60 g 1  . fluticasone (FLONASE) 50 MCG/ACT nasal spray Place 1 spray into both nostrils 2 (two) times daily as needed for allergies or rhinitis. 16 g 6  . Multiple Vitamin (MULTIVITAMIN WITH MINERALS) TABS tablet Take 1 tablet by mouth daily.    . predniSONE (DELTASONE) 10 MG tablet Take 1 tablet (10 mg total) by mouth daily with breakfast. 30 tablet 3  . saccharomyces boulardii (FLORASTOR) 250 MG capsule Take 1 capsule (250 mg total) by mouth 2 (two) times daily. (Patient taking differently: Take 250 mg by mouth daily. ) 60 capsule 0  . triamcinolone cream (KENALOG) 0.1 % Apply 1 application topically daily as needed (for irritation).   1  . vedolizumab (ENTYVIO) 300 MG injection Inject 300 mg into the vein every 8 (eight) weeks.     No current facility-administered medications for this visit.     Allergies as of 02/12/2019 - Review Complete 02/12/2019  Allergen Reaction Noted  . Eggs or egg-derived products Anaphylaxis 09/19/2012  . Peanut butter flavor Anaphylaxis 09/19/2012  . Peanut-containing drug products Anaphylaxis 09/19/2012    Family History  Problem Relation Age of Onset  . Seizures Mother   . Bipolar disorder Mother   . Hyperlipidemia Father   . Bipolar disorder Brother   . Anxiety disorder Brother   . Colon cancer Maternal Grandmother   . Stroke Maternal Grandmother   . Liver cancer Maternal Grandfather   . Esophageal cancer Neg Hx     Social History   Socioeconomic History  . Marital status: Single    Spouse name: Not on file  . Number of children: 0  . Years of education: Not on file  . Highest education level: Not on file  Occupational History  . Occupation: TActuary   [place] . Financial resource strain: Not on file  . Food insecurity    Worry: Not on file    Inability: Not on file  . Transportation needs    Medical: Not on file    Non-medical: Not on file  Tobacco Use  . Smoking status: Never Smoker  .  Smokeless tobacco: Never Used  Substance and Sexual Activity  . Alcohol use: No  . Drug use: No  . Sexual activity: Not Currently  Lifestyle  . Physical activity    Days per week: Not on file    Minutes per session: Not on file  . Stress: Not on file  Relationships  . Social c[place]bal[place] phone: Not on file    Gets together: Not on file    Attends religious service: Not on file    Active member of club or organization: Not on file    Attends meetings of clubs or organizations: Not on file    Relationship status: Not on file  . Intimate partner violence    Fear of current or ex partner: Not on file    Emotionally abused: Not on file    Physically abused: Not on file    Forced sexual activity: Not on file  Other Topics Concern  . Not on file  Social History Narrative  . Not on file  Physical Exam: BP 100/60   Pulse (!) 110   Temp 98.4 F (36.9 C)   Ht 5' 5.5" (1.664 m)   Wt 138 lb (62.6 kg)   BMI 22.62 kg/m  Constitutional: generally well-appearing Psychiatric: alert and oriented x3 Abdomen: soft, mildly tender throughout, nondistended, no obvious ascites, no peritoneal signs, normal bowel sounds No peripheral edema noted in lower extremities  Assessment and plan: 23 y.o. male with ulcerative colitis  Clinically still does not seem to be in remission.  He is having intermittent bleeding, urgency.  He does have solid stools however.  He is on 2.5 mg/kg azathioprine daily.  He is on prednisone at 15 mg daily.  His Entyvio level was less than target at trough.  I am waiting to see his Entyvio associated antibody testing results.  If it is negative then I am going to increase his Entyvio to 300 mg every 6 weeks rather than every 8.  We discussed that I may have him see 1 of my partners, also that he might need a repeat colonoscopy to truly establish where he is with his disease severity endoscopically.  He has a lot of paperwork for me to fill out regarding  disability at work.  He will return to see me in 4 or 6 weeks.  Please see the "Patient Instructions" section for addition details about the plan.  [name], MD [facility] 02/12/2019, 9:52 AM

## 2019-02-14 LAB — VEDOLIZUMAB AND ANTI-VEDO AB
Anti-Vedolizumab Antibody: <25 ng/mL
Vedolizumab: 12 ug/mL

## 2019-02-16 ENCOUNTER — Other Ambulatory Visit: Payer: Self-pay | Admitting: Gastroenterology

## 2019-02-19 ENCOUNTER — Other Ambulatory Visit: Payer: Self-pay | Admitting: Gastroenterology

## 2019-02-19 MED ORDER — PREDNISONE 10 MG PO TABS: 10.0000 mg | ORAL_TABLET | Freq: Every day | ORAL | 1 refills | Status: DC

## 2019-02-21 ENCOUNTER — Ambulatory Visit: Payer: BC Managed Care – PPO | Admitting: Gastroenterology

## 2019-03-09 ENCOUNTER — Telehealth: Payer: Self-pay | Admitting: Gastroenterology

## 2019-03-09 NOTE — Telephone Encounter (Signed)
Claiborne Billings do you know if the papers the pt left for Dr. Ardis Hughs have been completed?

## 2019-03-09 NOTE — Telephone Encounter (Signed)
Pt called inquiring about the status of a paper that he left for Dr. Ardis Hughs to sign. Pls call him

## 2019-03-12 NOTE — Telephone Encounter (Signed)
Spoke to patient to let him know that the formes have been signed on 02/10/19 and faxed to the number provided to the [facility]. Fax was confirmed that they received the documentation.

## 2019-03-13 ENCOUNTER — Ambulatory Visit: Payer: BC Managed Care – PPO | Admitting: Gastroenterology

## 2019-03-14 DIAGNOSIS — K5190 Ulcerative colitis, unspecified, without complications: Secondary | ICD-10-CM | POA: Diagnosis not present

## 2019-03-20 ENCOUNTER — Telehealth: Payer: Self-pay | Admitting: Gastroenterology

## 2019-03-20 NOTE — Telephone Encounter (Signed)
Christian Cardenas was last given on 10/14 this was at 6 weeks.  Has been having rectal bleeding with some movements with only blood.  Occasional clots.  The pt feels like entyvio is not working.  He says prednisone is the only thing that has ever really helped.  He also wants me to pass on that he was not able to keep his job due to UC.  He has an appt on 04/04/19 with Dr Ardis Hughs.

## 2019-04-04 ENCOUNTER — Ambulatory Visit: Payer: BC Managed Care – PPO | Admitting: Gastroenterology

## 2019-04-04 ENCOUNTER — Encounter: Payer: Self-pay | Admitting: Gastroenterology

## 2019-04-04 VITALS — BP 104/70 | HR 110 | Temp 98.4°F | Ht 65.5 in | Wt 140.0 lb

## 2019-04-04 DIAGNOSIS — Z1159 Encounter for screening for other viral diseases: Secondary | ICD-10-CM

## 2019-04-04 DIAGNOSIS — K5100 Ulcerative (chronic) pancolitis without complications: Secondary | ICD-10-CM

## 2019-04-04 MED ORDER — NA SULFATE-K SULFATE-MG SULF 17.5-3.13-1.6 GM/177ML PO SOLN: 1.0000 | Freq: Once | ORAL | 0 refills | Status: AC

## 2019-04-04 NOTE — Progress Notes (Signed)
Review of pertinent gastrointestinal problems: 1.Extensive, severeUC:Presented with abdominal pain, bloody diarrhea February 2019. CT scan showed pan colonic inflammation. Colonoscopy, Dr. Ardis Hughs April 2019 showed moderate to severe inflammation throughout his colon. Biopsies from this confirmed inflammatory bowel disease without granulomas. There was no rectal sparing. The terminal ileum was normal. He responded to prednisone and was started on oral full strength mesalamine.10/2017 he was clearly steroid dependent (could not wean off steroids without colitis symptoms recurring) and so decided to start entyvio  10/2017 labs: Hepatitis B surface antigen and antibody negative, Hepatitis C Ab negative, HIV negative, TB quant gold negative,TPMT normal metabolizer.  11/2017 Entyvio new start.  01/2018 seemingly non-responder to entyvio (sed rate 52, Gi path panel negative, clinically unchanged), so changed to remicade 23m/kg  Colonoscopy February 2020 showed normal terminal ileum, severe inflammation from anus to cecum. Multiple biopsies showed inflammation, no sign of pseudomembranes, no sign of viral infection.  March 2020 Remicade level undetectable, positive Remicade antibodies have developed(ADA 24AU)  4/20: entyvio antibodies negative; restarted entyvio(09/2018)at usual load protocol, also added azathiaprine 2270mkg, increased to 2.70m69mg.  Entyvio trough 12 (lower than ideal trough level), entyvio Abs negative  01/2019, changed dose to q6weeks 2. Acute C. Diff+ diarrhea,PCR confirmed 05/2017, his likely he caught this from his brother who had c. Diff a few weeks prior. Started on vanc 1270m38m QID for 2 weeks.The C. difficile seem to cause a significant flare of his ulcerative colitis,seecolonoscopy above February 2020 while he was admitted to the hospital for stabilization, hydration.   HPI: This is a very pleasant 23 y24r old man whom I last saw a month or 2 ago.  He really has  not noticed much improvement since [facility] every [facility]-week dosing.  He has only started a 6-week dosing a few weeks ago and so that may still take effect hopefully.  He is currently taking 15 mg of prednisone every day.  On this regimen he has solid stools 6-8 a day usually blood-tinged or frankly bloody.  He has left sided abdominal discomfort often.  He is not having any nocturnal issues.  No fevers or chills.  Chief complaint is IBD, ulcerative colitis  ROS: complete GI ROS as described in HPI, all other review negative.  Constitutional:  No unintentional weight loss   Past Medical History:  Diagnosis Date  . Clostridioides difficile infection 05/2018  . Eczema   [facility]  Past Surgical History:  Procedure Laterality Date  . BIOPSY  07/27/2018   Procedure: BIOPSY;  Surgeon: JacoMilus Banister;  [facility];  Service: Gastroenterology;;  . COLONOSCOPY W/ BIOPSIES    . COLONOSCOPY WITH PROPOFOL N/A 07/27/2018   Procedure: COLONOSCOPY WITH PROPOFOL;  Surgeon: JacoMilus Banister;  [facility];  Service: Gastroenterology;  Laterality: N/A;  . MOUTH SURGERY      Current Outpatient Medications  Medication Sig Dispense Refill  . azaTHIOprine (IMURAN) 50 MG tablet Take 2 1/2 tabs to equal 1270mg2mly 90 tablet 4  . betamethasone dipropionate (DIPROLENE) 0.05 % cream Apply topically 2 (two) times daily. 60 g 1  . fluticasone (FLONASE) 50 MCG/ACT nasal spray Place 1 spray into both nostrils 2 (two) times daily as needed for allergies or rhinitis. 16 g 6  . Multiple Vitamin (MULTIVITAMIN WITH MINERALS) TABS tablet Take 1 tablet by mouth daily.    . predniSONE (DELTASONE) 10 MG tablet Take 1 tablet (10 mg total) by mouth daily with breakfast.  Take 15 mg daily, 1.5 tabs daily 60 tablet 1  . saccharomyces boulardii (FLORASTOR) 250 MG capsule Take 250 mg by mouth daily.    Marland Kitchen triamcinolone cream (KENALOG) 0.1 % Apply 1 application  topically daily as needed (for irritation).   1  . vedolizumab (ENTYVIO) 300 MG injection Inject 300 mg into the vein every 8 (eight) weeks.     No current facility-administered medications for this visit.     Allergies as of 04/04/2019 - Review Complete 04/04/2019  Allergen Reaction Noted  . Eggs or egg-derived products Anaphylaxis 09/19/2012  . Peanut butter flavor Anaphylaxis 09/19/2012  . Peanut-containing drug products Anaphylaxis 09/19/2012    Family History  Problem Relation Age of Onset  . Seizures Mother   . Bipolar disorder Mother   . Hyperlipidemia Father   . Bipolar disorder Brother   . Anxiety disorder Brother   . Colon cancer Maternal Grandmother   . Stroke Maternal Grandmother   . Liver cancer Maternal Grandfather   . Esophageal cancer Neg Hx     Social History   Socioeconomic History  . Marital status: Single    Spouse name: Not on file  . Number of children: 0  . Years of education: Not on file  . Highest education level: Not on file  Occupational History  . Occupation: Actuary    Comment: in [place]  . Financial resource strain: Not on file  . Food insecurity    Worry: Not on file    Inability: Not on file  . Transportation needs    Medical: Not on file    Non-medical: Not on file  Tobacco Use  . Smoking status: Never Smoker  . Smokeless tobacco: Never Used  Substance and Sexual Activity  . Alcohol use: No  . Drug use: No  . Sexual activity: Not Currently  Lifestyle  . Physical activity    Days per week: Not on file    Minutes per session: Not on file  . Stress: Not on file  Relationships  . Social Herbalist on phone: Not on file    Gets together: Not on file    Attends religious service: Not on file    Active member of club or organization: Not on file    Attends meetings of clubs or organizations: Not on file    Relationship status: Not on file  . Intimate partner violence    Fear of current or ex  partner: Not on file    Emotionally abused: Not on file    Physically abused: Not on file    Forced sexual activity: Not on file  Other Topics Concern  . Not on file  Social History Narrative  . Not on file     Physical Exam: BP 104/70   Pulse (!) 110   Temp 98.4 F (36.9 C)   Ht 5' 5.5" (1.664 m)   Wt 140 lb (63.5 kg)   BMI 22.94 kg/m  Constitutional: generally well-appearing Psychiatric: alert and oriented x3 Abdomen: soft, nontender, nondistended, no obvious ascites, no peritoneal signs, normal bowel sounds No peripheral edema noted in lower extremities  Assessment and plan: 23 y.o. male with ulcerative colitis  I discussed this case with my partners.  We both agree that colonoscopy to reevaluate the endoscopic severity and extent of his disease is of primary importance now.  I am looking at a colonoscopy for him next week sometime.  He will stay on Entyvio at  every 6 week dosing and azathioprine 2.5 mg/kg for now.  He will stay on prednisone 15 mg daily for now as well.  He just cannot seem to wean past that.  Looking back at my colonoscopy records he did have a deep ulcer in his cecum February 2020, perhaps this indicates actual Crohn's disease, perhaps this indicates a viral superinfection such as CMV.  Please see the "Patient Instructions" section for addition details about the plan.  [name], MD [facility] 04/04/2019, 2:26 PM

## 2019-04-04 NOTE — Patient Instructions (Addendum)
If you are age 25 or older, your body mass index should be between 23-30. Your Body mass index is 22.94 kg/m. If this is out of the aforementioned range listed, please consider follow up with your Primary Care Provider.  If you are age 3 or younger, your body mass index should be between 19-25. Your Body mass index is 22.94 kg/m. If this is out of the aformentioned range listed, please consider follow up with your Primary Care Provider.   Continue taking Prednisone and Entyvio  You have been scheduled for a colonoscopy. Please follow written instructions given to you at your visit today.  Please pick up your prep supplies at the pharmacy within the next 1-3 days. If you use inhalers (even only as needed), please bring them with you on the day of your procedure.

## 2019-04-11 ENCOUNTER — Ambulatory Visit (INDEPENDENT_AMBULATORY_CARE_PROVIDER_SITE_OTHER): Payer: BC Managed Care – PPO

## 2019-04-11 ENCOUNTER — Other Ambulatory Visit: Payer: Self-pay | Admitting: Gastroenterology

## 2019-04-11 ENCOUNTER — Encounter: Payer: Self-pay | Admitting: Gastroenterology

## 2019-04-11 DIAGNOSIS — Z1159 Encounter for screening for other viral diseases: Secondary | ICD-10-CM | POA: Diagnosis not present

## 2019-04-12 LAB — SARS CORONAVIRUS 2 (TAT 6-24 HRS): SARS Coronavirus 2: NEGATIVE

## 2019-04-13 ENCOUNTER — Ambulatory Visit (AMBULATORY_SURGERY_CENTER): Payer: BC Managed Care – PPO | Admitting: Gastroenterology

## 2019-04-13 ENCOUNTER — Encounter: Payer: Self-pay | Admitting: Gastroenterology

## 2019-04-13 ENCOUNTER — Other Ambulatory Visit (HOSPITAL_COMMUNITY)
Admission: RE | Admit: 2019-04-13 | Discharge: 2019-04-13 | Disposition: A | Discharge status: Home / Self Care | Payer: BC Managed Care – PPO | Source: Other Acute Inpatient Hospital | Attending: Gastroenterology | Admitting: Gastroenterology

## 2019-04-13 ENCOUNTER — Other Ambulatory Visit: Payer: Self-pay

## 2019-04-13 VITALS — BP 98/62 | HR 85 | Temp 98.2°F | Resp 18 | Ht 65.0 in | Wt 140.0 lb

## 2019-04-13 DIAGNOSIS — K626 Ulcer of anus and rectum: Secondary | ICD-10-CM | POA: Diagnosis not present

## 2019-04-13 DIAGNOSIS — K5150 Left sided colitis without complications: Secondary | ICD-10-CM | POA: Diagnosis not present

## 2019-04-13 DIAGNOSIS — K5100 Ulcerative (chronic) pancolitis without complications: Secondary | ICD-10-CM | POA: Diagnosis not present

## 2019-04-13 DIAGNOSIS — K529 Noninfective gastroenteritis and colitis, unspecified: Secondary | ICD-10-CM | POA: Diagnosis not present

## 2019-04-13 DIAGNOSIS — Z1211 Encounter for screening for malignant neoplasm of colon: Secondary | ICD-10-CM | POA: Diagnosis not present

## 2019-04-13 MED ORDER — SODIUM CHLORIDE 0.9 % IV SOLN: 500.0000 mL | Freq: Once | INTRAVENOUS | Status: DC

## 2019-04-13 MED ORDER — MESALAMINE 4 G RE ENEM: 4.0000 g | ENEMA | Freq: Every day | RECTAL | Status: DC

## 2019-04-13 NOTE — Progress Notes (Signed)
Called to room to assist during endoscopic procedure.  Patient ID and intended procedure confirmed with present staff. Received instructions for my participation in the procedure from the performing physician.  

## 2019-04-13 NOTE — Op Note (Signed)
[place] Patient Name: Christian Cardenas Procedure Date: 04/13/2019 3:52 PM MRN: [id number] Endoscopist: Milus Banister , MD Age: 23 Referring MD:  Date of Birth: [birth year] Gender: Male Account #: [id number] Procedure:                Colonoscopy Indications:              Severe, extensive UC Medicines:                Monitored Anesthesia Care Procedure:                Pre-Anesthesia Assessment:                           - Prior to the procedure, a History and Physical                            was performed, and patient medications and                            allergies were reviewed. The patient's tolerance of                            previous anesthesia was also reviewed. The risks                            and benefits of the procedure and the sedation                            options and risks were discussed with the patient.                            All questions were answered, and informed consent                            was obtained. Prior Anticoagulants: The patient has                            taken no previous anticoagulant or antiplatelet                            agents. ASA Grade Assessment: II - A patient with                            mild systemic disease. After reviewing the risks                            and benefits, the patient was deemed in                            satisfactory condition to undergo the procedure.                           After obtaining informed consent, the colonoscope  was passed under direct vision. Throughout the                            procedure, the patient's blood pressure, pulse, and                            oxygen saturations were monitored continuously. The                            Colonoscope was introduced through the anus and                            advanced to the the terminal ileum. The colonoscopy                            was performed without difficulty. The  patient                            tolerated the procedure well. The quality of the                            bowel preparation was good. The terminal ileum,                            ileocecal valve, appendiceal orifice, and rectum                            were photographed. Scope In: 3:55:30 PM Scope Out: 4:19:26 PM Scope Withdrawal Time: 0 hours 19 minutes 9 seconds  Total Procedure Duration: 0 hours 23 minutes 56 seconds  Findings:                 The terminal ileum appeared normal.                           There was severe inflammation from the anus to                            about 35cm with a long indistinct transtion to only                            mildly inflamed mucosa throughout the rest of the                            colon.                           The deep ulcer in the cecum noted earlier this year                            has completely healed.                           There was moderate periappendiceal orifice  inflammation.                           The colon was biopsied:                           Jar 1: right colon mild inflammation                           Jar 2: left colon mild inflammation                           Jar 3: severe inflammation from anus to 35cm (some                            sent for routine pathology, some sent for viral                            culture.                           All samples were labled to check for signs of viral                            infection/CMV.                           The exam was otherwise without abnormality on                            direct and retroflexion views. Complications:            No immediate complications. Estimated blood loss:                            None. Estimated Blood Loss:     Estimated blood loss: none. Impression:               - Overall improved colitis compared to colonsocopy                            07/2018. The distal most 35cm of the  colon is still                            severely inflamed. Multiple biopsies taken an sent                            for routine path as well as viral testing. Recommendation:           - Patient has a contact number available for                            emergencies. The signs and symptoms of potential                            delayed complications were discussed with the  patient. Return to normal activities tomorrow.                            Written discharge instructions were provided to the                            patient.                           - Resume previous diet.                           - Continue present medications.                           - Await pathology results.                           - New prescription today for rowasa enema 4grams.                            Insert one enema every night at bedtime, dispense                            30 with 11 refills. Milus Banister, MD 04/13/2019 4:32:00 PM This report has been signed electronically.

## 2019-04-13 NOTE — Patient Instructions (Signed)
Await pathology results.  Prescription for Rowasa enema 4 grams.  Insert 1 enema every night at bedtime.  YOU HAD AN ENDOSCOPIC PROCEDURE TODAY AT [facility]:   Refer to the procedure report that was given to you for any specific questions about what was found during the examination.  If the procedure report does not answer your questions, please call your gastroenterologist to clarify.  If you requested that your care partner not be given the details of your procedure findings, then the procedure report has been included in a sealed envelope for you to review at your convenience later.  YOU SHOULD EXPECT: Some feelings of bloating in the abdomen. Passage of more gas than usual.  Walking can help get rid of the air that was put into your GI tract during the procedure and reduce the bloating. If you had a lower endoscopy (such as a colonoscopy or flexible sigmoidoscopy) you may notice spotting of blood in your stool or on the toilet paper. If you underwent a bowel prep for your procedure, you may not have a normal bowel movement for a few days.  Please Note:  You might notice some irritation and congestion in your nose or some drainage.  This is from the oxygen used during your procedure.  There is no need for concern and it should clear up in a day or so.  SYMPTOMS TO REPORT IMMEDIATELY:   Following lower endoscopy (colonoscopy or flexible sigmoidoscopy):  Excessive amounts of blood in the stool  Significant tenderness or worsening of abdominal pains  Swelling of the abdomen that is new, acute  Fever of 100F or higher    For urgent or emergent issues, a gastroenterologist can be reached at any hour by calling [phone].   DIET:  We do recommend a small meal at first, but then you may proceed to your regular diet.  Drink plenty of fluids but you should avoid alcoholic beverages for 24 hours.  ACTIVITY:  You should plan to take it easy for the rest of today and you  should NOT DRIVE or use heavy machinery until tomorrow (because of the sedation medicines used during the test).    FOLLOW UP: Our staff will call the number listed on your records 48-72 hours following your procedure to check on you and address any questions or concerns that you may have regarding the information given to you following your procedure. If we do not reach you, we will leave a message.  We will attempt to reach you two times.  During this call, we will ask if you have developed any symptoms of COVID 19. If you develop any symptoms (ie: fever, flu-like symptoms, shortness of breath, cough etc.) before then, please call [phone].  If you test positive for Covid 19 in the 2 weeks post procedure, please call and report this information to [place].    If any biopsies were taken you will be contacted by phone or by letter within the next 1-3 weeks.  Please call us at [phone] if you have not heard about the biopsies in 3 weeks.    SIGNATURES/CONFIDENTIALITY: You and/or your care partner have signed paperwork which will be entered into your electronic medical record.  These signatures attest to the fact that that the information above on your After Visit Summary has been reviewed and is understood.  Full responsibility of the confidentiality of this discharge information lies with you and/or your care-partner.

## 2019-04-13 NOTE — Progress Notes (Signed)
A and O x3. Report to RN. Tolerated MAC anesthesia well.

## 2019-04-13 NOTE — Progress Notes (Signed)
VS by CW. Temp by LC.

## 2019-04-16 ENCOUNTER — Telehealth: Payer: Self-pay | Admitting: Gastroenterology

## 2019-04-16 ENCOUNTER — Other Ambulatory Visit: Payer: Self-pay | Admitting: *Deleted

## 2019-04-16 MED ORDER — MESALAMINE 4 G RE ENEM: 4.0000 g | ENEMA | Freq: Every day | RECTAL | 11 refills | Status: DC

## 2019-04-16 NOTE — Telephone Encounter (Signed)
Pt states that his pharmacy has not received prescription for mesalamine.

## 2019-04-16 NOTE — Telephone Encounter (Signed)
Informed patient that I sent the medication to the pharmacy. Apologized for the inconvenience.

## 2019-04-17 ENCOUNTER — Telehealth: Payer: Self-pay | Admitting: *Deleted

## 2019-04-17 NOTE — Telephone Encounter (Signed)
  Follow up Call-  Call back number 04/13/2019 09/06/2017  Post procedure Call Back phone  # [phone]  Permission to leave phone message Yes Yes  Some recent data might be hidden     Patient questions:  Do you have a fever, pain , or abdominal swelling? No. Pain Score  0 *  Have you tolerated food without any problems? Yes.    Have you been able to return to your normal activities? Yes.    Do you have any questions about your discharge instructions: Diet   No. Medications  Yes.   Follow up visit  No.  Do you have questions or concerns about your Care? No.  Actions: * If pain score is 4 or above: No action needed, pain <4.  Pt. Wondered if he could use medication earlier so he could get sleep. Instructed pt. To use at bedtime but could experiment with time to see what works better for him and his rest,verbalize understanding also instructed pt. To contact doctor with any problems or concerns.   1. Have you developed a fever since your procedure? no  2.   Have you had an respiratory symptoms (SOB or cough) since your procedure? no  3.   Have you tested positive for COVID 19 since your procedure no  4.   Have you had any family members/close contacts diagnosed with the COVID 19 since your procedure?  no   If yes to any of these questions please route to Joylene John, RN and Alphonsa Gin, Therapist, sports.

## 2019-04-23 LAB — VIRUS CULTURE

## 2019-04-24 ENCOUNTER — Telehealth: Payer: Self-pay | Admitting: Gastroenterology

## 2019-04-24 NOTE — Telephone Encounter (Signed)
See result note.  

## 2019-04-24 NOTE — Telephone Encounter (Signed)
Dr Ardis Hughs please advise

## 2019-04-25 DIAGNOSIS — K5190 Ulcerative colitis, unspecified, without complications: Secondary | ICD-10-CM | POA: Diagnosis not present

## 2019-05-13 ENCOUNTER — Other Ambulatory Visit: Payer: Self-pay | Admitting: Gastroenterology

## 2019-05-14 NOTE — Telephone Encounter (Signed)
Is this okay to refill? 

## 2019-05-15 ENCOUNTER — Telehealth: Payer: Self-pay | Admitting: Gastroenterology

## 2019-05-15 NOTE — Telephone Encounter (Signed)
Informed patient that script was sent into pharmacy.

## 2019-06-05 ENCOUNTER — Ambulatory Visit: Payer: BC Managed Care – PPO | Admitting: Gastroenterology

## 2019-06-05 ENCOUNTER — Encounter: Payer: Self-pay | Admitting: Gastroenterology

## 2019-06-05 VITALS — BP 104/74 | HR 96 | Temp 98.0°F | Ht 65.5 in | Wt 143.5 lb

## 2019-06-05 DIAGNOSIS — K5100 Ulcerative (chronic) pancolitis without complications: Secondary | ICD-10-CM

## 2019-06-05 NOTE — Patient Instructions (Addendum)
If you are age 26 or older, your body mass index should be between 23-30. Your Body mass index is 23.52 kg/m. If this is out of the aforementioned range listed, please consider follow up with your Primary Care Provider.  If you are age 7 or younger, your body mass index should be between 19-25. Your Body mass index is 23.52 kg/m. If this is out of the aformentioned range listed, please consider follow up with your Primary Care Provider.   Referral placed to Dr. Denton Lank at [facility].

## 2019-06-05 NOTE — Progress Notes (Signed)
Review of pertinent gastrointestinal problems: 1.Extensive, severeUC:Presented with abdominal pain, bloody diarrhea February 2019. CT scan showed pan colonic inflammation. Colonoscopy, Dr. Ardis Hughs April 2019showed moderate to severe inflammation throughout his colon. Biopsies from this confirmed inflammatory bowel disease without granulomas. There was no rectal sparing. The terminal ileum was normal. He responded to prednisone and was started on oral full strength mesalamine.10/2017 he was clearly steroid dependent (could not wean off steroids without colitis symptoms recurring) and so decided to start entyvio  10/2017 labs: Hepatitis B surface antigen and antibody negative, Hepatitis C Ab negative, HIV negative, TB quant gold negative,TPMT normal metabolizer.  11/2017 Entyvio new start.  01/2018 seemingly non-responder to entyvio (sed rate 52, Gi path panel negative, clinically unchanged), so changed to remicade 58m/kg  Colonoscopy February 2020showed normal terminal ileum, severe inflammation from anus to cecum. Deep ulcer in cecum. Multiple biopsies showed inflammation, no sign of pseudomembranes, no sign of viral infection.  March 2020 Remicade level undetectable, positive Remicade antibodies have developed(ADA 24AU)  4/20: entyvio antibodies negative; restarted entyvio(09/2018)at usual load protocol, also added azathiaprine 264mkg, increased to 2.66m76mg.  Entyvio trough 12 (lower than ideal trough level), entyvio Abs negative  01/2019, changed dose to q6weeks  Colonoscopy November 2020 severe inflammation from the anus to 35 cm with a long indistinct transition to only mildly inflamed mucosa throughout the rest of the colon.  Terminal ileum was normal.  The deep ulcer has completely healed.  Biopsies showed ulcerative colitis, no sign of viral infection.  Viral culture also negative.  Rowasa enema started 4 g nightly. 2. Acute C. Diff+ diarrhea,PCR confirmed 05/2017, his likely he  caught this from his brother who had c. Diff a few weeks prior. Started on vanc 1266m79m QID for 2 weeks.The C. difficile seem tocause a significantflare ofhis ulcerative colitis,seecolonoscopy above February 2020 while he was admitted to the hospital for stabilization, hydration.  HPI: This is a very pleasant 23 y2r old man who is here with his mother today.  I last saw him at the time of his colonoscopy.  See those results summarized above.  I was actually encouraged by the colonoscopy given that his previous colonoscopy about 2 years ago showed severe inflammation throughout his entire colon whereas a colonoscopy November 2020 showed severe inflammation from the anus to about 35 cm with mild inflammation throughout the remaining colon.  Extensive biopsies were taken, viral cultures were done as well.  After the colonoscopy I started on Rowasa enemas to add to his Entyvio every 6 weeks, 2.5 mg of azathioprine daily, prednisone at 15 mg daily.  Since then he really does not feel much better.  He has a lot of rectal spasms lower abdominal pains, urgency, he bleeds nearly daily still.  Chief complaint is severe extensive ulcerative colitis  ROS: complete GI ROS as described in HPI, all other review negative.  Constitutional:  No unintentional weight loss   Past Medical History:  Diagnosis Date  . Clostridioides difficile infection 05/2018  . Eczema   . [facility]  Past Surgical History:  Procedure Laterality Date  . BIOPSY  07/27/2018   Procedure: BIOPSY;  Surgeon: JacoMilus Banister;  [facility];  Service: Gastroenterology;;  . COLONOSCOPY W/ BIOPSIES    . COLONOSCOPY WITH PROPOFOL N/A 07/27/2018   Procedure: COLONOSCOPY WITH PROPOFOL;  Surgeon: JacoMilus Banister;  Location: [facility];  Service: Gastroenterology;  Laterality: N/A;  . MOUTH SURGERY      Current Outpatient Medications  Medication Sig Dispense Refill  . azaTHIOprine (IMURAN)  50 MG tablet Take 2 1/2 tabs to equal 117m daily 90 tablet 4  . betamethasone dipropionate (DIPROLENE) 0.05 % cream Apply topically 2 (two) times daily. 60 g 1  . fluticasone (FLONASE) 50 MCG/ACT nasal spray Place 1 spray into both nostrils 2 (two) times daily as needed for allergies or rhinitis. 16 g 6  . mesalamine (ROWASA) 4 g enema Place 60 mLs (4 g total) rectally at bedtime. 30 enema 11  . Multiple Vitamin (MULTIVITAMIN WITH MINERALS) TABS tablet Take 1 tablet by mouth daily.    . predniSONE (DELTASONE) 10 MG tablet TAKE 1 & 1/2 (ONE & ONE-HALF) TABLETS BY MOUTH ONCE DAILY WITH BREAKFAST 60 tablet 0  . saccharomyces boulardii (FLORASTOR) 250 MG capsule Take 250 mg by mouth daily.    .Marland Kitchentriamcinolone cream (KENALOG) 0.1 % Apply 1 application topically daily as needed (for irritation).   1  . vedolizumab (ENTYVIO) 300 MG injection Inject 300 mg into the vein every 8 (eight) weeks.     No current facility-administered medications for this visit.    Allergies as of 06/05/2019 - Review Complete 06/05/2019  Allergen Reaction Noted  . Eggs or egg-derived products Anaphylaxis 09/19/2012  . Peanut butter flavor Anaphylaxis 09/19/2012  . Peanut-containing drug products Anaphylaxis 09/19/2012    Family History  Problem Relation Age of Onset  . Seizures Mother   . Bipolar disorder Mother   . Hyperlipidemia Father   . Bipolar disorder Brother   . Anxiety disorder Brother   . Colon cancer Maternal Grandmother   . Stroke Maternal Grandmother   . Liver cancer Maternal Grandfather   . Esophageal cancer Neg Hx   . Rectal cancer Neg Hx   . Stomach cancer Neg Hx     Social History   Socioeconomic History  . Marital status: Single    Spouse name: Not on file  . Number of children: 0  . Years of education: Not on file  . Highest education level: Not on file  Occupational History  . [place]ation[place]  . Smoking status: Never Smoker  .  Smokeless tobacco: Never Used  Substance and Sexual Activity  . Alcohol use: No  . Drug use: No  . Sexual activity: Not Currently  Other Topics Concern  . Not on file  Social History Narrative  . Not on file   Social Determinants of Health   Financial Resource Strain:   . Difficulty of Paying Living Expenses: Not on file  Food Insecurity:   . Worried About RCharity fundraiserin the Last Year: Not on file  . Ran Out of Food in the Last Year: Not on file  Transportation Needs:   . Lack of Transportation (Medical): Not on file  . Lack of Transportation (Non-Medical): Not on file  Physical Activity:   . Days of Exercise per Week: Not on file  . Minutes of Exercise per Session: Not on file  Stress:   . Feeling of Stress : Not on file  Social Connections:   . Frequency of Communication with Friends and Family: Not on file  . Frequency of Social Gatherings with Friends and Family: Not on file  . Attends Religious Services: Not on file  . Active Member of Clubs or Organizations: Not on file  . Attends CArchivistMeetings: Not on file  . Marital Status: Not on file  Intimate Partner  Violence:   . Fear of Current or Ex-Partner: Not on file  . Emotionally Abused: Not on file  . Physically Abused: Not on file  . Sexually Abused: Not on file     Physical Exam: BP 104/74 (BP Location: Left Arm, Patient Position: Sitting, Cuff Size: Normal)   Pulse 96   Temp 98 F (36.7 C)   Ht 5' 5.5" (1.664 m)   Wt 143 lb 8 oz (65.1 kg)   BMI 23.52 kg/m  Constitutional: generally well-appearing Psychiatric: alert and oriented x3 Abdomen: soft, nontender, nondistended, no obvious ascites, no peritoneal signs, normal bowel sounds No peripheral edema noted in lower extremities  Assessment and plan: 24 y.o. male with extensive ulcerative colitis  He has been battling this for about 2 years now.  For the past couple months at least he has been on Entyvio every 6 weeks, azathioprine  maxed at 2.5 mg/kg daily.  He is on prednisone 15 mg daily.  Although the severity of his colitis has improved the most severe inflammation is limited to the last 35 cm of his colon now he is understandably frustrated.  He is still quite symptomatic.  We discussed colectomy which he and his mother were interested here about but are not really ready at this point.  I recommended referral to tertiary care facility, [facility] inflammatory bowel disease expert Dr. Denton Lank for his opinion and recommendations on other medical options that still might be available.  He and his mother were interested in that referral.  In the meantime he will continue on the medical regimen described above.  Please see the "Patient Instructions" section for addition details about the plan.  [name], MD [facility] 06/05/2019, 11:27 AM

## 2019-06-05 NOTE — Addendum Note (Signed)
Addended by: Horris Latino on: 06/05/2019 02:28 PM   Modules accepted: Orders

## 2019-06-06 ENCOUNTER — Telehealth: Payer: Self-pay | Admitting: *Deleted

## 2019-06-06 NOTE — Telephone Encounter (Signed)
Referred patient to Dr. Renee Harder at [facility]. Appointment scheduled for 07/03/19 @ 10:30 am. Patient aware.

## 2019-06-07 DIAGNOSIS — K51011 Ulcerative (chronic) pancolitis with rectal bleeding: Secondary | ICD-10-CM | POA: Diagnosis not present

## 2019-06-07 NOTE — Telephone Encounter (Signed)
Late note. Dr. Renee Harder office called and offered patient a virtual for 06/07/19 @ 8 am. Informed patient of change on 06/06/19

## 2019-06-14 DIAGNOSIS — K5190 Ulcerative colitis, unspecified, without complications: Secondary | ICD-10-CM | POA: Diagnosis not present

## 2019-06-15 ENCOUNTER — Other Ambulatory Visit: Payer: Self-pay | Admitting: Gastroenterology

## 2019-06-15 IMAGING — DX DG ABDOMEN 2V
2 series · 2 of 2 positions shown · non-contrast
Comparison: CT 07/14/2017

CLINICAL DATA: Recurrent colitis.  Left side abdominal pain.

EXAM:
ABDOMEN - 2 VIEW

[abdomen erect]
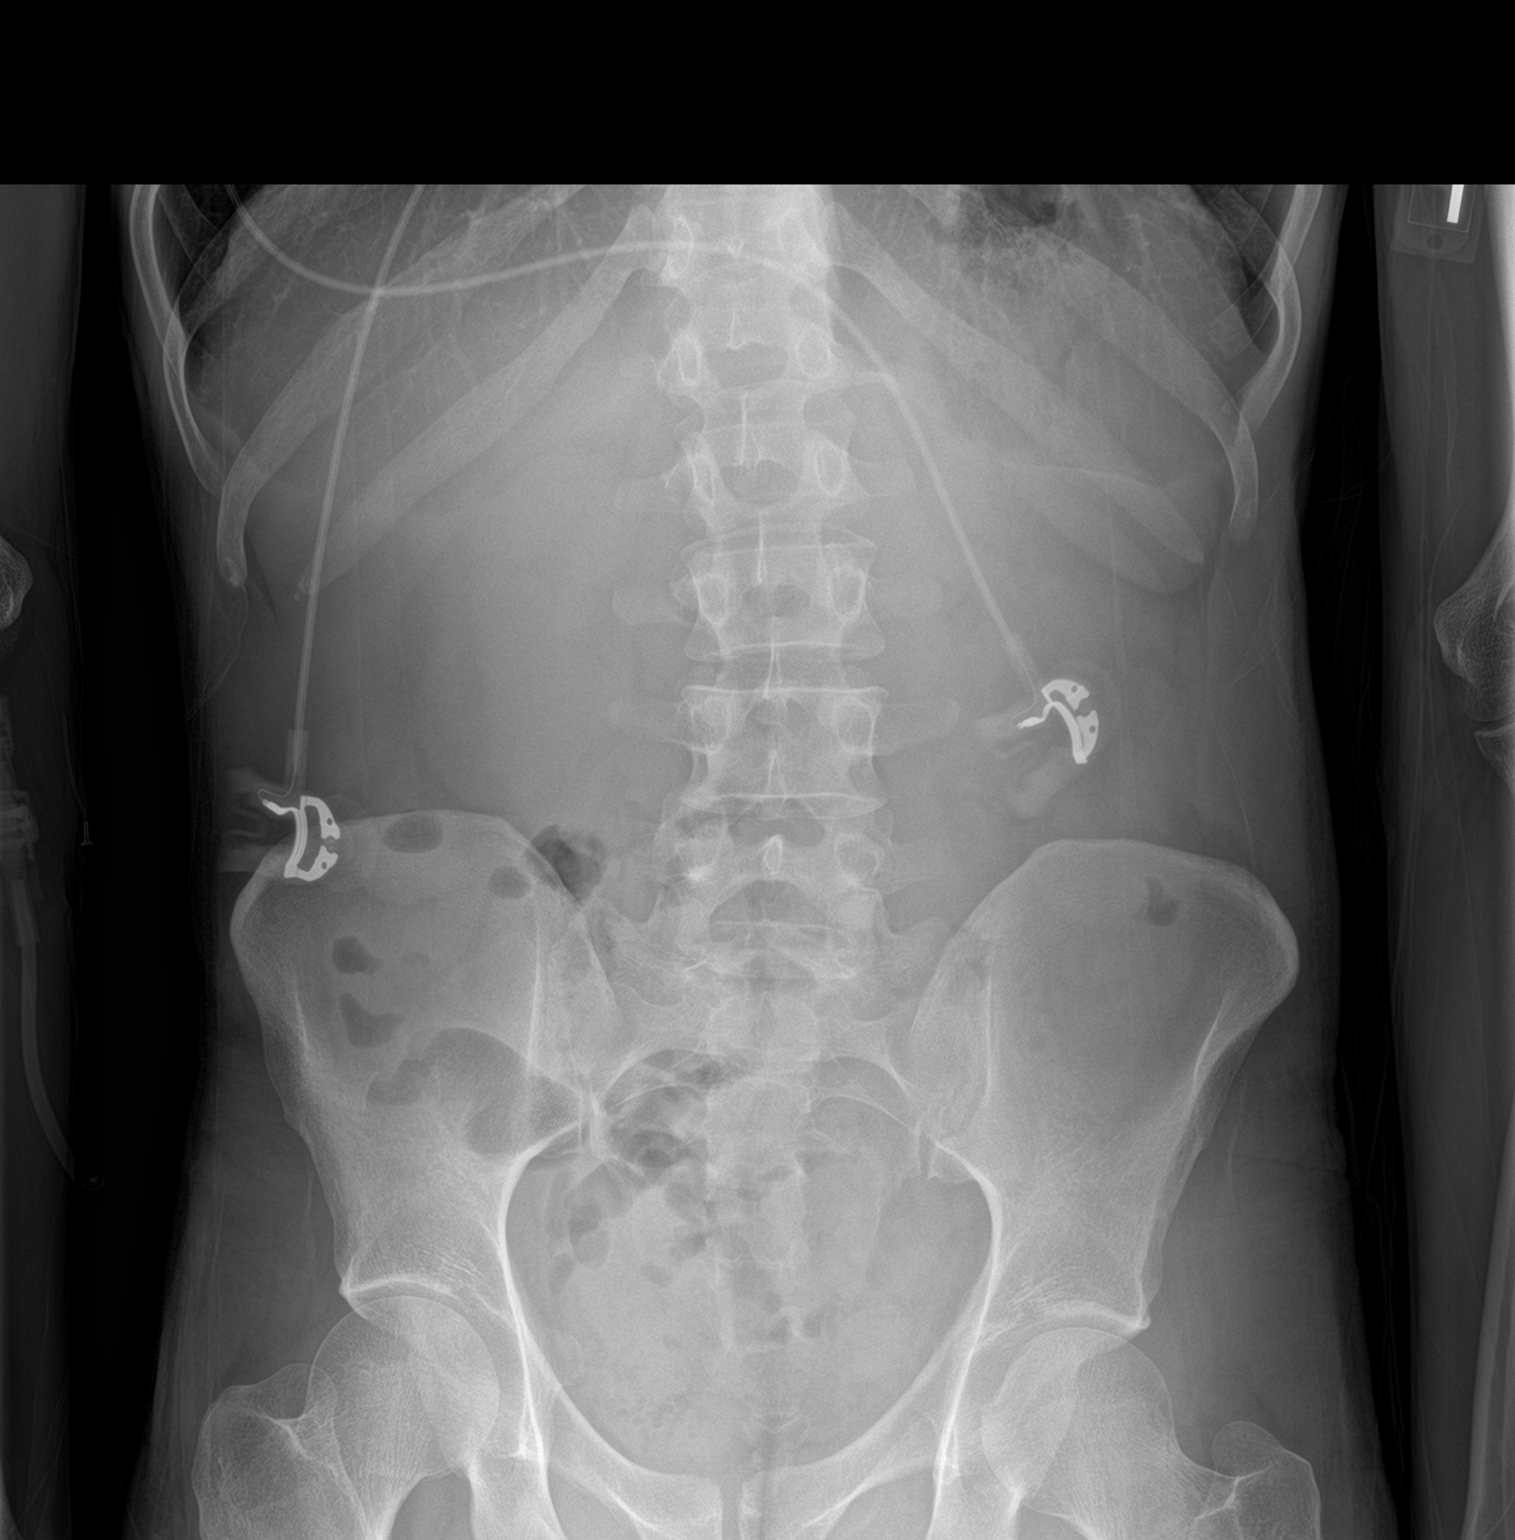

[abdomen supine]
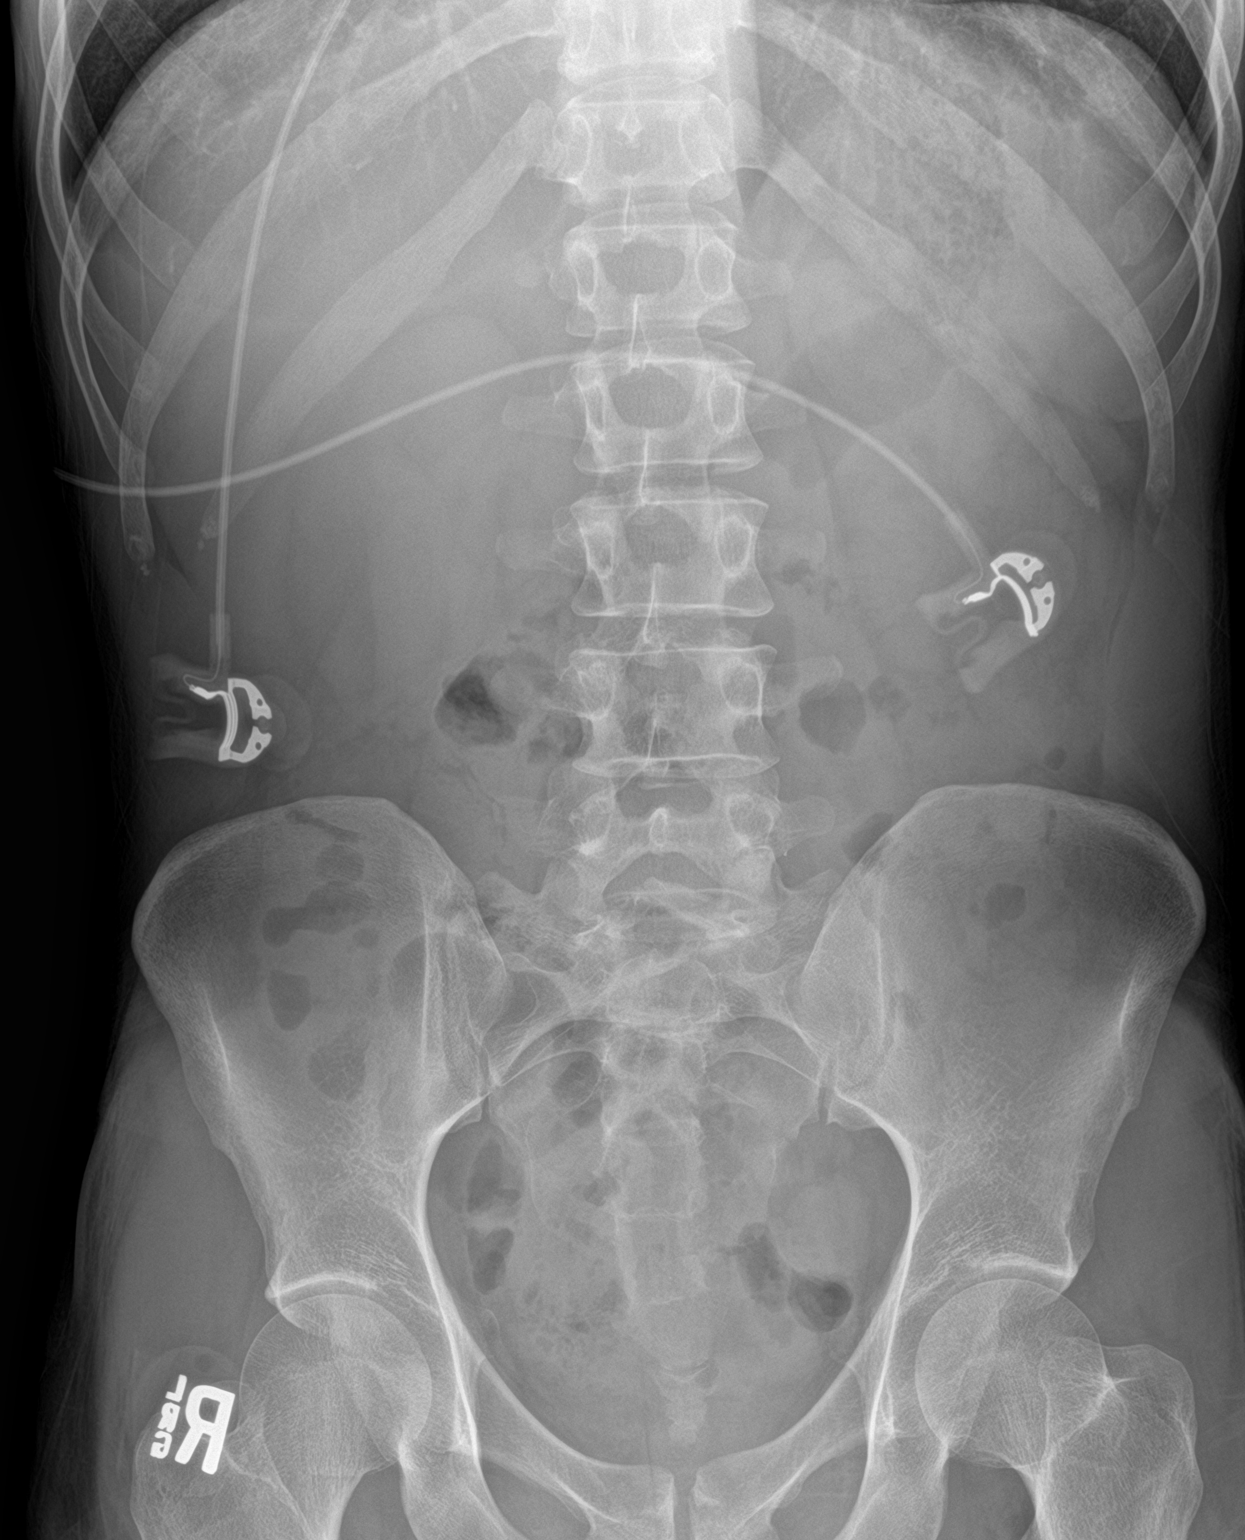

[2 of 2 positions shown; findings below may reference images not displayed]

FINDINGS: The bowel gas pattern is normal. There is no evidence of free air.
No radio-opaque calculi or other significant radiographic
abnormality is seen.
IMPRESSION: Negative.

## 2019-06-22 ENCOUNTER — Other Ambulatory Visit: Payer: Self-pay | Admitting: Gastroenterology

## 2019-07-05 DIAGNOSIS — Z79899 Other long term (current) drug therapy: Secondary | ICD-10-CM | POA: Diagnosis not present

## 2019-07-05 DIAGNOSIS — K51011 Ulcerative (chronic) pancolitis with rectal bleeding: Secondary | ICD-10-CM | POA: Diagnosis not present

## 2019-07-30 DIAGNOSIS — L219 Seborrheic dermatitis, unspecified: Secondary | ICD-10-CM | POA: Diagnosis not present

## 2019-08-30 ENCOUNTER — Ambulatory Visit: Payer: BC Managed Care – PPO

## 2019-08-31 ENCOUNTER — Ambulatory Visit: Payer: BC Managed Care – PPO | Attending: Internal Medicine

## 2019-08-31 DIAGNOSIS — Z23 Encounter for immunization: Secondary | ICD-10-CM

## 2019-08-31 NOTE — Progress Notes (Signed)
   Covid-19 Vaccination Clinic  Name:  Ozzie Knobel    MRN: [id number] DOB: 04-30-1996  08/31/2019  Mr. Crutchfield was observed post Covid-19 immunization for 30 minutes based on pre-vaccination screening without incident. He was provided with Vaccine Information Sheet and instruction to access the V-Safe system.   Mr. Glasscock was instructed to call 911 with any severe reactions post vaccine: Marland Kitchen Difficulty breathing  . Swelling of face and throat  . A fast heartbeat  . A bad rash all over body  . Dizziness and weakness   Immunizations Administered    Name Date Dose VIS Date Route   Moderna COVID-19 Vaccine 08/31/2019 11:57 AM 0.5 mL 05/01/2019 Intramuscular   Manufacturer: Moderna   Lot: [id number]   [place]: [id number]

## 2019-09-11 DIAGNOSIS — K51011 Ulcerative (chronic) pancolitis with rectal bleeding: Secondary | ICD-10-CM | POA: Diagnosis not present

## 2019-10-02 ENCOUNTER — Ambulatory Visit: Payer: BC Managed Care – PPO | Attending: Internal Medicine

## 2019-10-02 DIAGNOSIS — Z23 Encounter for immunization: Secondary | ICD-10-CM

## 2019-10-02 NOTE — Progress Notes (Signed)
   Covid-19 Vaccination Clinic  Name:  Christian Cardenas    MRN: [id number] DOB: June 25, 1995  10/02/2019  Christian Cardenas was observed post Covid-19 immunization for 30 minutes based on pre-vaccination screening without incident. He was provided with Vaccine Information Sheet and instruction to access the V-Safe system.   Christian Cardenas was instructed to call 911 with any severe reactions post vaccine: Marland Kitchen Difficulty breathing  . Swelling of face and throat  . A fast heartbeat  . A bad rash all over body  . Dizziness and weakness   Immunizations Administered    Name Date Dose VIS Date Route   Moderna COVID-19 Vaccine 10/02/2019 10:25 AM 0.5 mL 05/2019 Intramuscular   Manufacturer: Moderna   Lot: [id number]   [place]: [id number]

## 2019-11-22 DIAGNOSIS — K51011 Ulcerative (chronic) pancolitis with rectal bleeding: Secondary | ICD-10-CM | POA: Diagnosis not present

## 2019-11-22 DIAGNOSIS — K5190 Ulcerative colitis, unspecified, without complications: Secondary | ICD-10-CM | POA: Diagnosis not present

## 2019-11-22 DIAGNOSIS — Z9229 Personal history of other drug therapy: Secondary | ICD-10-CM | POA: Diagnosis not present

## 2020-02-11 DIAGNOSIS — L218 Other seborrheic dermatitis: Secondary | ICD-10-CM | POA: Diagnosis not present

## 2020-03-20 DIAGNOSIS — K5190 Ulcerative colitis, unspecified, without complications: Secondary | ICD-10-CM | POA: Diagnosis not present

## 2020-03-20 DIAGNOSIS — K51011 Ulcerative (chronic) pancolitis with rectal bleeding: Secondary | ICD-10-CM | POA: Diagnosis not present

## 2020-03-20 DIAGNOSIS — Z7952 Long term (current) use of systemic steroids: Secondary | ICD-10-CM | POA: Diagnosis not present

## 2021-10-27 DIAGNOSIS — K5100 Ulcerative (chronic) pancolitis without complications: Secondary | ICD-10-CM | POA: Diagnosis not present

## 2022-01-08 DIAGNOSIS — K51011 Ulcerative (chronic) pancolitis with rectal bleeding: Secondary | ICD-10-CM | POA: Diagnosis not present

## 2022-01-28 ENCOUNTER — Encounter: Payer: Self-pay | Admitting: Family Medicine

## 2022-01-28 ENCOUNTER — Ambulatory Visit: Payer: BC Managed Care – PPO | Admitting: Family Medicine

## 2022-01-28 VITALS — BP 102/62 | HR 98 | Temp 98.4°F | Ht 65.5 in | Wt 129.0 lb

## 2022-01-28 DIAGNOSIS — Z0001 Encounter for general adult medical examination with abnormal findings: Secondary | ICD-10-CM

## 2022-01-28 DIAGNOSIS — K5100 Ulcerative (chronic) pancolitis without complications: Secondary | ICD-10-CM

## 2022-01-28 DIAGNOSIS — Z0000 Encounter for general adult medical examination without abnormal findings: Secondary | ICD-10-CM

## 2022-01-28 DIAGNOSIS — L2084 Intrinsic (allergic) eczema: Secondary | ICD-10-CM

## 2022-01-28 MED ORDER — HYDROCORTISONE 2.5 % EX CREA: TOPICAL_CREAM | Freq: Two times a day (BID) | CUTANEOUS | 1 refills | Status: AC

## 2022-01-28 MED ORDER — TRIAMCINOLONE ACETONIDE 0.1 % EX CREA: 1.0000 | TOPICAL_CREAM | Freq: Every day | CUTANEOUS | 1 refills | Status: AC | PRN

## 2022-01-28 NOTE — Progress Notes (Signed)
Complete physical exam  Patient: Christian Cardenas   DOB: [birth year]   26 y.o. Male  MRN: [id number]  Subjective:    Chief Complaint  Patient presents with   New Patient (Initial Visit)   Annual Exam    Christian Cardenas is a 26 y.o. male who presents today for a complete physical exam. He reports consuming a general diet. Gym/ health club routine includes cardio and light weights. He generally feels well. He reports sleeping fairly well. He does have additional problems to discuss today.   He sees dermatology for eczema. He has been using kenalog and hydrocortisone cream prn for his symptoms. He has not been seen by dermatology in over a year. He would like refills on his creams today. He reports that symptoms are usually well controlled but he has had increased dryness and itching recently has he has been out of his creams.   He also has UC and is managed by GI for this. He reports that this is also well controlled on his current regimen.   He declines labs today. He has had an elevated lipid panel in 2021 and does not recall if he was fasting or not.   Most recent fall risk assessment:    01/28/2022    2:21 PM  [place] in the past year? 0     Most recent depression screenings:    01/28/2022    2:21 PM 08/30/2017    2:26 PM  PHQ 2/9 Scores  PHQ - 2 Score 0 0  PHQ- 9 Score 0       Past Medical History:  Diagnosis Date   Clostridioides difficile infection 05/2018   Eczema    Ulcerative colitis [facility])       Patient Care Team: Gwenlyn Perking, FNP as PCP - General (Family Medicine)   Outpatient Medications Prior to Visit  Medication Sig   Multiple Vitamin (MULTIVITAMIN WITH MINERALS) TABS tablet Take 1 tablet by mouth daily.   saccharomyces boulardii (FLORASTOR) 250 MG capsule Take 250 mg by mouth daily.   XELJANZ XR 11 MG TB24 Take 1 tablet by mouth daily.   [DISCONTINUED] hydrocortisone 2.5 % cream Apply topically 2 (two) times daily.   [DISCONTINUED]  triamcinolone cream (KENALOG) 0.1 % Apply 1 application topically daily as needed (for irritation).    [DISCONTINUED] azaTHIOprine (IMURAN) 50 MG tablet TAKE 2 & 1/2 (TWO & ONE-HALF) TABLETS BY MOUTH ONCE DAILY (125MG  DAILY)   [DISCONTINUED] betamethasone dipropionate (DIPROLENE) 0.05 % cream Apply topically 2 (two) times daily.   [DISCONTINUED] fluticasone (FLONASE) 50 MCG/ACT nasal spray Place 1 spray into both nostrils 2 (two) times daily as needed for allergies or rhinitis.   [DISCONTINUED] mesalamine (ROWASA) 4 g enema Place 60 mLs (4 g total) rectally at bedtime.   [DISCONTINUED] predniSONE (DELTASONE) 10 MG tablet TAKE 1 & 1/2 (ONE & ONE-HALF) TABLETS BY MOUTH ONCE DAILY WITH BREAKFAST   [DISCONTINUED] vedolizumab (ENTYVIO) 300 MG injection Inject 300 mg into the vein every 8 (eight) weeks.   No facility-administered medications prior to visit.    ROS Negative unless specially indicated above in HPI.     Objective:     BP 102/62   Pulse 98   Temp 98.4 F (36.9 C) (Temporal)   Ht 5' 5.5" (1.664 m)   Wt 129 lb (58.5 kg)   SpO2 96%   BMI 21.14 kg/m    Physical Exam Vitals and nursing note reviewed.  Constitutional:  General: He is not in acute distress.    Appearance: He is not ill-appearing.  HENT:     Head: Normocephalic.     Right Ear: Tympanic membrane, ear canal and external ear normal.     Left Ear: Tympanic membrane, ear canal and external ear normal.     Nose: Nose normal.     Mouth/Throat:     Mouth: Mucous membranes are moist.     Pharynx: Oropharynx is clear.  Eyes:     Extraocular Movements: Extraocular movements intact.     Conjunctiva/sclera: Conjunctivae normal.     Pupils: Pupils are equal, round, and reactive to light.  Neck:     Thyroid: No thyroid mass, thyromegaly or thyroid tenderness.  Cardiovascular:     Rate and Rhythm: Normal rate and regular rhythm.     Pulses: Normal pulses.     Heart sounds: Normal heart sounds. No murmur  heard.    No friction rub. No gallop.  Pulmonary:     Effort: Pulmonary effort is normal.     Breath sounds: Normal breath sounds.  Abdominal:     General: Bowel sounds are normal. There is no distension.     Palpations: Abdomen is soft. There is no mass.     Tenderness: There is no abdominal tenderness. There is no guarding.  Musculoskeletal:        General: No swelling. Normal range of motion.     Cervical back: Normal range of motion and neck supple. No tenderness.     Right lower leg: No edema.     Left lower leg: No edema.  Skin:    General: Skin is warm and dry.     Capillary Refill: Capillary refill takes less than 2 seconds.     Comments: Eczema present to bilateral hands and arms  Neurological:     General: No focal deficit present.     Mental Status: He is alert and oriented to person, place, and time.     Motor: No weakness.     Gait: Gait normal.  Psychiatric:        Mood and Affect: Mood normal.        Behavior: Behavior normal.        Thought Content: Thought content normal.      No results found for any visits on 01/28/22.     Assessment & Plan:    Routine Health Maintenance and Physical Exam  Christian Cardenas was seen today for new patient (initial visit) and annual exam.  Diagnoses and all orders for this visit:  Routine general medical examination at a health care facility Declined labs today.   Intrinsic eczema Managed by dermatology. Refills provided while patient is awaiting an appointment.  -     hydrocortisone 2.5 % cream; Apply topically 2 (two) times daily. -     triamcinolone cream (KENALOG) 0.1 %; Apply 1 Application topically daily as needed (for irritation).  Ulcerative pancolitis without complication ([place]) Managed by GI. Reports well controlled.    Immunization History  Administered Date(s) Administered   DTaP 09/02/1993, 11/04/1993, 01/28/1994, 09/09/1994   Hepatitis A 12/03/2005, 12/12/2008   Hepatitis B 1995-09-15, 09/02/1993,  11/04/1993   HiB (PRP-OMP) 09/02/1993, 11/04/1993, 01/28/1994, 09/09/1994   IPV 09/02/1993, 11/04/1993, 01/28/1994, 06/12/1998   MMR 09/09/1994, 06/12/1998   Moderna Sars-Covid-2 Vaccination 08/31/2019, 10/02/2019   Pneumococcal Conjugate-13 11/15/2017   Pneumococcal Polysaccharide-23 01/03/2018   Tdap 12/03/2005   Varicella 11/03/1998    Health Maintenance  Topic Date Due  INFLUENZA VACCINE  08/29/2022 (Originally 12/29/2021)   TETANUS/TDAP  01/29/2023 (Originally 12/04/2015)   COVID-19 Vaccine (3 - Moderna series) 02/14/2023 (Originally 11/27/2019)   Hepatitis C Screening  Completed   HIV Screening  Completed   HPV VACCINES  Aged Out    Discussed health benefits of physical activity, and encouraged him to engage in regular exercise appropriate for his age and condition.  Problem List Items Addressed This Visit       Digestive   Ulcerative pancolitis without complication (HCC)     Musculoskeletal and Integument   Eczema   Relevant Medications   hydrocortisone 2.5 % cream   triamcinolone cream (KENALOG) 0.1 %   Other Visit Diagnoses     Routine general medical examination at a health care facility    -  Primary      Return in 1 year (on 01/29/2023) for CPE.     Gwenlyn Perking, FNP

## 2022-01-28 NOTE — Patient Instructions (Signed)
Health Maintenance, Male Adopting a healthy lifestyle and getting preventive care are important in promoting health and wellness. Ask your health care provider about: The right schedule for you to have regular tests and exams. Things you can do on your own to prevent diseases and keep yourself healthy. What should I know about diet, weight, and exercise? Eat a healthy diet  Eat a diet that includes plenty of vegetables, fruits, low-fat dairy products, and lean protein. Do not eat a lot of foods that are high in solid fats, added sugars, or sodium. Maintain a healthy weight Body mass index (BMI) is a measurement that can be used to identify possible weight problems. It estimates body fat based on height and weight. Your health care provider can help determine your BMI and help you achieve or maintain a healthy weight. Get regular exercise Get regular exercise. This is one of the most important things you can do for your health. Most adults should: Exercise for at least 150 minutes each week. The exercise should increase your heart rate and make you sweat (moderate-intensity exercise). Do strengthening exercises at least twice a week. This is in addition to the moderate-intensity exercise. Spend less time sitting. Even light physical activity can be beneficial. Watch cholesterol and blood lipids Have your blood tested for lipids and cholesterol at 28 years of age, then have this test every 5 years. You may need to have your cholesterol levels checked more often if: Your lipid or cholesterol levels are high. You are older than 28 years of age. You are at high risk for heart disease. What should I know about cancer screening? Many types of cancers can be detected early and may often be prevented. Depending on your health history and family history, you may need to have cancer screening at various ages. This may include screening for: Colorectal cancer. Prostate cancer. Skin cancer. Lung  cancer. What should I know about heart disease, diabetes, and high blood pressure? Blood pressure and heart disease High blood pressure causes heart disease and increases the risk of stroke. This is more likely to develop in people who have high blood pressure readings or are overweight. Talk with your health care provider about your target blood pressure readings. Have your blood pressure checked: Every 3-5 years if you are 18-39 years of age. Every year if you are 40 years old or older. If you are between the ages of 65 and 75 and are a current or former smoker, ask your health care provider if you should have a one-time screening for abdominal aortic aneurysm (AAA). Diabetes Have regular diabetes screenings. This checks your fasting blood sugar level. Have the screening done: Once every three years after age 45 if you are at a normal weight and have a low risk for diabetes. More often and at a younger age if you are overweight or have a high risk for diabetes. What should I know about preventing infection? Hepatitis B If you have a higher risk for hepatitis B, you should be screened for this virus. Talk with your health care provider to find out if you are at risk for hepatitis B infection. Hepatitis C Blood testing is recommended for: Everyone born from 1945 through 1965. Anyone with known risk factors for hepatitis C. Sexually transmitted infections (STIs) You should be screened each year for STIs, including gonorrhea and chlamydia, if: You are sexually active and are younger than 28 years of age. You are older than 28 years of age and your   health care provider tells you that you are at risk for this type of infection. Your sexual activity has changed since you were last screened, and you are at increased risk for chlamydia or gonorrhea. Ask your health care provider if you are at risk. Ask your health care provider about whether you are at high risk for HIV. Your health care provider  may recommend a prescription medicine to help prevent HIV infection. If you choose to take medicine to prevent HIV, you should first get tested for HIV. You should then be tested every 3 months for as long as you are taking the medicine. Follow these instructions at home: Alcohol use Do not drink alcohol if your health care provider tells you not to drink. If you drink alcohol: Limit how much you have to 0-2 drinks a day. Know how much alcohol is in your drink. In the [place] one drink equals one 12 oz bottle of beer (355 mL), one 5 oz glass of wine (148 mL), or one 1 oz glass of hard liquor (44 mL). Lifestyle Do not use any products that contain nicotine or tobacco. These products include cigarettes, chewing tobacco, and vaping devices, such as e-cigarettes. If you need help quitting, ask your health care provider. Do not use street drugs. Do not share needles. Ask your health care provider for help if you need support or information about quitting drugs. General instructions Schedule regular health, dental, and eye exams. Stay current with your vaccines. Tell your health care provider if: You often feel depressed. You have ever been abused or do not feel safe at home. Summary Adopting a healthy lifestyle and getting preventive care are important in promoting health and wellness. Follow your health care provider's instructions about healthy diet, exercising, and getting tested or screened for diseases. Follow your health care provider's instructions on monitoring your cholesterol and blood pressure. This information is not intended to replace advice given to you by your health care provider. Make sure you discuss any questions you have with your health care provider. Document Revised: 10/06/2020 Document Reviewed: 10/06/2020 Elsevier Patient Education  2023 Elsevier Inc.  

## 2022-04-15 DIAGNOSIS — K51011 Ulcerative (chronic) pancolitis with rectal bleeding: Secondary | ICD-10-CM | POA: Diagnosis not present

## 2022-05-07 DIAGNOSIS — K51011 Ulcerative (chronic) pancolitis with rectal bleeding: Secondary | ICD-10-CM | POA: Diagnosis not present

## 2022-06-23 DIAGNOSIS — L309 Dermatitis, unspecified: Secondary | ICD-10-CM | POA: Diagnosis not present

## 2022-09-07 DIAGNOSIS — K51011 Ulcerative (chronic) pancolitis with rectal bleeding: Secondary | ICD-10-CM | POA: Diagnosis not present

## 2022-10-13 DIAGNOSIS — K51011 Ulcerative (chronic) pancolitis with rectal bleeding: Secondary | ICD-10-CM | POA: Diagnosis not present

## 2022-11-30 ENCOUNTER — Ambulatory Visit: Payer: BC Managed Care – PPO | Admitting: Behavioral Health

## 2022-11-30 DIAGNOSIS — Z0389 Encounter for observation for other suspected diseases and conditions ruled out: Secondary | ICD-10-CM

## 2022-11-30 NOTE — Progress Notes (Signed)
I explained to patient that I will not be accepting  him on as a patient for care at this time.  He is requesting psychological testing or complete ADHD testing at this time. I also concur that he would benefit from testing  as there is suspect of social or developmental  presentation. I provided him with  contact information for:   [facility] Psychological Assessment [facility]  [facility].

## 2023-01-04 DIAGNOSIS — F901 Attention-deficit hyperactivity disorder, predominantly hyperactive type: Secondary | ICD-10-CM | POA: Diagnosis not present

## 2023-01-04 DIAGNOSIS — F419 Anxiety disorder, unspecified: Secondary | ICD-10-CM | POA: Diagnosis not present

## 2023-01-13 DIAGNOSIS — K51011 Ulcerative (chronic) pancolitis with rectal bleeding: Secondary | ICD-10-CM | POA: Diagnosis not present

## 2023-01-26 DIAGNOSIS — F419 Anxiety disorder, unspecified: Secondary | ICD-10-CM | POA: Diagnosis not present

## 2023-01-26 DIAGNOSIS — F901 Attention-deficit hyperactivity disorder, predominantly hyperactive type: Secondary | ICD-10-CM | POA: Diagnosis not present

## 2023-02-01 DIAGNOSIS — F901 Attention-deficit hyperactivity disorder, predominantly hyperactive type: Secondary | ICD-10-CM | POA: Diagnosis not present

## 2023-02-01 DIAGNOSIS — F419 Anxiety disorder, unspecified: Secondary | ICD-10-CM | POA: Diagnosis not present

## 2023-02-09 ENCOUNTER — Ambulatory Visit: Payer: BC Managed Care – PPO | Admitting: Family Medicine

## 2023-02-09 ENCOUNTER — Encounter: Payer: Self-pay | Admitting: Family Medicine

## 2023-02-09 VITALS — BP 108/64 | HR 84 | Temp 97.5°F | Ht 65.5 in | Wt 146.0 lb

## 2023-02-09 DIAGNOSIS — F909 Attention-deficit hyperactivity disorder, unspecified type: Secondary | ICD-10-CM | POA: Diagnosis not present

## 2023-02-09 DIAGNOSIS — Z13220 Encounter for screening for lipoid disorders: Secondary | ICD-10-CM | POA: Diagnosis not present

## 2023-02-09 LAB — LIPID PANEL
Chol/HDL Ratio: 6.6 ratio — ABNORMAL HIGH (ref 0.0–5.0)
Cholesterol, Total: 238 mg/dL — ABNORMAL HIGH (ref 100–199)
HDL: 36 mg/dL — ABNORMAL LOW (ref >39)
LDL Chol Calc (NIH): 156 mg/dL — ABNORMAL HIGH (ref 0–99)
Triglycerides: 250 mg/dL — ABNORMAL HIGH (ref 0–149)
VLDL Cholesterol Cal: 46 mg/dL — ABNORMAL HIGH (ref 5–40)

## 2023-02-09 NOTE — Progress Notes (Signed)
   Established Patient Office Visit  Subjective   Patient ID: Christian Cardenas, male    DOB: 1995/08/14  Age: 27 y.o. MRN: [id number]  Chief Complaint  Patient presents with   ADHD    HPI Christian Cardenas has been evaluated and diagnosed with ADHD with a provider in [facility]. He dropped off the report yesterday for review. He is interested in started medication for treatment to help manage his symptoms as they do affect his productivity at work. He is also contemplating counseling. There was also some concern for a possible autism diagnosis. He is not currently interested in seeking additional evaluation for this.      ROS As per HPI.   Objective:     BP 108/64   Pulse 84   Temp (!) 97.5 F (36.4 C) (Oral)   Ht 5' 5.5" (1.664 m)   Wt 146 lb (66.2 kg)   SpO2 98%   BMI 23.93 kg/m    Physical Exam Vitals and nursing note reviewed.  Constitutional:      General: He is not in acute distress.    Appearance: He is not ill-appearing, toxic-appearing or diaphoretic.  HENT:     Mouth/Throat:     Mouth: Mucous membranes are moist.     Pharynx: Oropharynx is clear.  Pulmonary:     Effort: Pulmonary effort is normal. No respiratory distress.  Musculoskeletal:     Right lower leg: No edema.     Left lower leg: No edema.  Skin:    General: Skin is warm and dry.     Findings: Rash (eczema present) present.  Neurological:     General: No focal deficit present.     Mental Status: He is alert and oriented to person, place, and time.  Psychiatric:        Mood and Affect: Mood normal.        Behavior: Behavior normal.      No results found for any visits on 02/09/23.    The ASCVD Risk score (Arnett DK, et al., 2019) failed to calculate for the following reasons:   The 2019 ASCVD risk score is only valid for ages 34 to 39    Assessment & Plan:    Christian Cardenas was seen today for adhd.  Diagnoses and all orders for this visit:  Adult ADHD Reviewed psychological evaluation reprot  with dx of ADHD. Discussed and placed referral to [facility] for treatment.  -     Ambulatory referral to Psychiatry  Encounter for screening for lipid disorder Fasting lipid panel pending today.  -     Lipid panel    Return for schedule CPE whenever available.   The patient indicates understanding of these issues and agrees with the plan.  Gabriel Earing, FNP

## 2023-02-09 NOTE — Patient Instructions (Addendum)
[facility] Location [phone]?[place], [place] [address] Phone: [phone] Fax: [phone] [email or web address]

## 2023-02-28 ENCOUNTER — Telehealth: Payer: Self-pay | Admitting: Behavioral Health

## 2023-02-28 NOTE — Telephone Encounter (Signed)
I would wait to schedule an appt until [name] has had the chance to review any testing. He is out of the office until Monday.

## 2023-02-28 NOTE — Telephone Encounter (Signed)
He called back right after I finished this note. I scheduled him but I also sent [name] a note to let me know if he can't see him for some other reason.  I didn't want to wait till [name] came back and push the potential appt even further out.  Pt has offered to provide the results before the appt, so I told him I would call him back after [name] returns on 10/7.

## 2023-02-28 NOTE — Telephone Encounter (Signed)
Pt LVM asking for appt with [name].  I was unable to reach him. I contacted his mom, Lupita Leash, who is on the [facility]  I advised her of the note from [employer] at [facility] visit in July.  She said pt has gone to [facility] and was diagnosed with ADHD.  He was told to call us back to schedule a follow up appt.  I assume [name] is ok to see him now or ?  I asked mom to have pt to call back for appt.

## 2023-03-10 DIAGNOSIS — K51011 Ulcerative (chronic) pancolitis with rectal bleeding: Secondary | ICD-10-CM | POA: Diagnosis not present

## 2023-03-21 ENCOUNTER — Ambulatory Visit: Payer: BC Managed Care – PPO | Admitting: Behavioral Health

## 2023-03-21 ENCOUNTER — Encounter: Payer: Self-pay | Admitting: Behavioral Health

## 2023-03-21 DIAGNOSIS — F902 Attention-deficit hyperactivity disorder, combined type: Secondary | ICD-10-CM

## 2023-03-21 MED ORDER — AMPHETAMINE-DEXTROAMPHETAMINE 10 MG PO TABS
10.0000 mg | ORAL_TABLET | Freq: Every day | ORAL | 0 refills | Status: DC
Start: 2023-03-21 — End: 2023-04-18

## 2023-03-21 NOTE — Progress Notes (Signed)
[facility] MD/PA/NP Initial Note  03/21/2023 1:09 PM Christian Cardenas  MRN:  [id number]  Chief Complaint:  Chief Complaint   Anxiety; ADHD; Establish Care; Patient Education; Follow-up     HPI:   "Cartel", 27 year old male patient was seen in this office for initial visit and to establish care.  He originally presented on 7/2 but it was determined that he needed other evaluation before establishing care at this office.  He was not charged on this date.  He was originally interested in being tested for autism and I referred him to [facility].  The testing revealed that patient may have some mild autism and recommended further testing at a different location.  However the patient was diagnosed with ADHD and is interested in pursuing medication to help.  He continues to report that he struggles at times at work with staying focused and able to complete his task.  Patient hand-delivered a copy of his psychological evaluation with him at today's visit.  He denies depression but reports his anxiety is 3/10.  He is sleeping 7 to 8 hours per night.  He is very talkative and at times difficult to focus.  He denies any history of mania, no psychosis, no auditory or visual hallucinations.  He denies SI or HI.  There is family history of bipolar disorder.  Patient feels safe and verbally contracted for safety with this Clinical research associate.  No past psychiatric medication trials    Visit Diagnosis:    ICD-10-CM   1. Attention deficit hyperactivity disorder (ADHD), combined type  F90.2 amphetamine-dextroamphetamine (ADDERALL) 10 MG tablet      Past Psychiatric History: none  Past Medical History:  Past Medical History:  Diagnosis Date   Clostridioides difficile infection 05/2018   Eczema    Ulcerative colitis [facility])     Past Surgical History:  Procedure Laterality Date   BIOPSY  07/27/2018   Procedure: BIOPSY;  Surgeon: Rachael Fee, MD;  Location: [facility]  ENDOSCOPY;  Service: Gastroenterology;;   COLONOSCOPY W/ BIOPSIES     COLONOSCOPY WITH PROPOFOL N/A 07/27/2018   Procedure: COLONOSCOPY WITH PROPOFOL;  Surgeon: Rachael Fee, MD;  Location: [facility] ENDOSCOPY;  Service: Gastroenterology;  Laterality: N/A;   MOUTH SURGERY      Family Psychiatric History: Bipolar  Family History:  Family History  Problem Relation Age of Onset   Diabetes Mother    Depression Mother    Asthma Mother    ADD / ADHD Mother    Seizures Mother    Bipolar disorder Mother    Diabetes Father    Hyperlipidemia Father    ADD / ADHD Brother    Bipolar disorder Brother    Hearing loss Brother    Anxiety disorder Brother    Colon cancer Maternal Grandmother    Stroke Maternal Grandmother    Liver cancer Maternal Grandfather    Esophageal cancer Neg Hx    Rectal cancer Neg Hx    Stomach cancer Neg Hx     Social History:  Social History   Socioeconomic History   Marital status: Single    Spouse name: Not on file   Number of children: 0   Years of education: 16   Highest education level: Bachelor's degree (e.g., BA, AB, BS)  Occupational History   Occupation: Herbalist    Comment: in [place]  Tobacco Use   Smoking status: Never   Smokeless tobacco: Never  Vaping Use   Vaping status: Never Used  Substance and  Sexual Activity   Alcohol use: No   Drug use: No   Sexual activity: Never  Other Topics Concern   Not on file  Social History Narrative   Not on file   Social Determinants of Health   Financial Resource Strain: Not on file  Food Insecurity: Not on file  Transportation Needs: Not on file  Physical Activity: Not on file  Stress: Not on file  Social Connections: Not on file    Allergies:  Allergies  Allergen Reactions   Egg-Derived Products Anaphylaxis    AND MAYONNAISE Eczema reaction too   Peanut Butter Flavor Anaphylaxis    ezcema issues also   Peanut-Containing Drug Products Anaphylaxis    Metabolic Disorder Labs: No  results found for: "HGBA1C", "MPG" No results found for: "PROLACTIN" Lab Results  Component Value Date   CHOL 238 (H) 02/09/2023   TRIG 250 (H) 02/09/2023   HDL 36 (L) 02/09/2023   CHOLHDL 6.6 (H) 02/09/2023   LDLCALC 156 (H) 02/09/2023   No results found for: "TSH"  Therapeutic Level Labs: No results found for: "LITHIUM" No results found for: "VALPROATE" No results found for: "CBMZ"  Current Medications: Current Outpatient Medications  Medication Sig Dispense Refill   amphetamine-dextroamphetamine (ADDERALL) 10 MG tablet Take 1 tablet (10 mg total) by mouth daily with breakfast. 30 tablet 0   hydrocortisone 2.5 % cream Apply topically 2 (two) times daily. 30 g 1   Multiple Vitamin (MULTIVITAMIN WITH MINERALS) TABS tablet Take 1 tablet by mouth daily.     saccharomyces boulardii (FLORASTOR) 250 MG capsule Take 250 mg by mouth daily.     triamcinolone cream (KENALOG) 0.1 % Apply 1 Application topically daily as needed (for irritation). 30 g 1   XELJANZ XR 11 MG TB24 Take 1 tablet by mouth daily.     No current facility-administered medications for this visit.    Medication Side Effects: none  Orders placed this visit:  No orders of the defined types were placed in this encounter.   Psychiatric Specialty Exam:  Review of Systems  Constitutional: Negative.   Allergic/Immunologic: Negative.   Neurological: Negative.   Psychiatric/Behavioral:  Positive for decreased concentration.     There were no vitals taken for this visit.There is no height or weight on file to calculate BMI.  General Appearance: Casual, Neat, and Well Groomed  Eye Contact:  Good  Speech:  Clear and Coherent  Volume:  Normal  Mood:  NA  Affect:  Appropriate  Thought Process:  Coherent  Orientation:  Full (Time, Place, and Person)  Thought Content: Logical   Suicidal Thoughts:  No  Homicidal Thoughts:  No  Memory:  WNL  Judgement:  Good  Insight:  Good  Psychomotor Activity:  Normal   Concentration:  Concentration: Good  Recall:  Good  Fund of Knowledge: Good  Language: Good  Assets:  Desire for Improvement  ADL's:  Intact  Cognition: WNL  Prognosis:  Good   Screenings:  GAD-7    Flowsheet Row Office Visit from 02/09/2023 in [facility] Office Visit from 01/28/2022 in [facility]  Total GAD-7 Score 1 0      PHQ2-9    Flowsheet Row Office Visit from 02/09/2023 in [facility] Office Visit from 01/28/2022 in [facility] Office Visit from 08/30/2017 in [facility] Office Visit from 06/17/2017 in [facility]  Visit from 09/19/2012 in [facility]  PHQ-2 Total Score 0 0 0 0 2  PHQ-9 Total Score 2 0 -- -- 9       Receiving Psychotherapy: No   Treatment Plan/Recommendations:   Greater than 50% of 30 min  face to face time with patient was spent on counseling and coordination of care. We discussed his recent testing at [facility].  It was determined that there was high probability of ADHD, and it was recommended that further testing for autism be conducted.  I reviewed his report and further discussed medication options. We agreed today to: Will start Adderall 10 mg IR daily after breakfast Will report worsening symptoms or side effects promptly Will follow-up in 4 weeks to reassess Provided emergency contact information Rx sent to [facility] in [place] Reviewed PDMP    Joan Flores, NP

## 2023-04-18 ENCOUNTER — Ambulatory Visit: Payer: BC Managed Care – PPO | Admitting: Behavioral Health

## 2023-04-18 ENCOUNTER — Encounter: Payer: Self-pay | Admitting: Behavioral Health

## 2023-04-18 DIAGNOSIS — F902 Attention-deficit hyperactivity disorder, combined type: Secondary | ICD-10-CM | POA: Diagnosis not present

## 2023-04-18 MED ORDER — AMPHETAMINE-DEXTROAMPHETAMINE 10 MG PO TABS: 10.0000 mg | ORAL_TABLET | Freq: Every day | ORAL | 0 refills | Status: DC

## 2023-04-18 NOTE — Progress Notes (Signed)
[facility] Med Check  Patient ID: Christian Cardenas,  MRN: [id number]  PCP: Gabriel Earing, FNP  Date of Evaluation: 04/18/2023 Time spent:30 minutes  Chief Complaint:  Chief Complaint   Follow-up; Medication Refill; Patient Education; ADHD     HISTORY/CURRENT STATUS: HPI  "Christian Cardenas", 27 year old male patient was seen in this office for initial visit and to establish care. Pt was extremely talkative today. Mother was with him with his verbal consent. She does feel like Adderall has helped with his focus, and able to complete more task without being distracted.    He denies depression but reports his anxiety is 2/10.  He is sleeping 7 to 8 hours per night.  He is very talkative and at times difficult to focus.  He denies any history of mania, no psychosis, no auditory or visual hallucinations.  He denies SI or HI.  There is family history of bipolar disorder.  Patient feels safe and verbally contracted for safety with this Clinical research associate.   No past psychiatric medication trials     Individual Medical History/ Review of Systems: Changes? :No   Allergies: Egg-derived products, Peanut butter flavor, and Peanut-containing drug products  Current Medications:  Current Outpatient Medications:    amphetamine-dextroamphetamine (ADDERALL) 10 MG tablet, Take 1 tablet (10 mg total) by mouth daily with breakfast., Disp: 30 tablet, Rfl: 0   hydrocortisone 2.5 % cream, Apply topically 2 (two) times daily., Disp: 30 g, Rfl: 1   Multiple Vitamin (MULTIVITAMIN WITH MINERALS) TABS tablet, Take 1 tablet by mouth daily., Disp: , Rfl:    saccharomyces boulardii (FLORASTOR) 250 MG capsule, Take 250 mg by mouth daily., Disp: , Rfl:    triamcinolone cream (KENALOG) 0.1 %, Apply 1 Application topically daily as needed (for irritation)., Disp: 30 g, Rfl: 1   XELJANZ XR 11 MG TB24, Take 1 tablet by mouth daily., Disp: , Rfl:  Medication Side Effects: none  Family Medical/ Social History: Changes? No  MENTAL  HEALTH EXAM:  There were no vitals taken for this visit.There is no height or weight on file to calculate BMI.  General Appearance: Casual, Neat, and Well Groomed  Eye Contact:  NA  Speech:  Clear and Coherent  Volume:  Normal  Mood:  Anxious, Depressed, and Dysphoric  Affect:  Appropriate  Thought Process:  Coherent  Orientation:  Full (Time, Place, and Person)  Thought Content: Logical   Suicidal Thoughts:  No  Homicidal Thoughts:  No  Memory:  WNL  Judgement:  Good  Insight:  Good  Psychomotor Activity:  Normal  Concentration:  Concentration: Good  Recall:  Good  Fund of Knowledge: Good  Language: Good  Assets:  Desire for Improvement  ADL's:  Intact  Cognition: WNL  Prognosis:  Good    DIAGNOSES:    ICD-10-CM   1. Attention deficit hyperactivity disorder (ADHD), combined type  F90.2       Receiving Psychotherapy: No    RECOMMENDATIONS:   Greater than 50% of 30 min  face to face time with patient was spent on counseling and coordination of care. We discussed his recent testing at [facility].  He was very talkative today and very difficult to focus. I still have strong suspicion  that mild Autism complicates dx.  His mother does agree that Adderall has helped some with focus and attention.  Hx of Autism and Bipolar in family.  We agreed today to: To continue. Adderall 10 mg IR daily after breakfast Will report worsening symptoms or side effects  promptly Will follow-up in 3 months to reassess Provided emergency contact information Rx sent to [facility] in [place] Reviewed PDMP        Joan Flores, NP

## 2023-05-19 ENCOUNTER — Ambulatory Visit: Payer: Self-pay | Admitting: Family Medicine

## 2023-05-19 ENCOUNTER — Ambulatory Visit: Payer: BC Managed Care – PPO | Admitting: Family Medicine

## 2023-05-19 ENCOUNTER — Encounter: Payer: Self-pay | Admitting: Family Medicine

## 2023-05-19 VITALS — BP 113/77 | HR 79 | Temp 97.7°F | Ht 65.5 in | Wt 151.2 lb

## 2023-05-19 DIAGNOSIS — Z0001 Encounter for general adult medical examination with abnormal findings: Secondary | ICD-10-CM

## 2023-05-19 DIAGNOSIS — Z0000 Encounter for general adult medical examination without abnormal findings: Secondary | ICD-10-CM

## 2023-05-19 DIAGNOSIS — E782 Mixed hyperlipidemia: Secondary | ICD-10-CM

## 2023-05-19 DIAGNOSIS — Z13228 Encounter for screening for other metabolic disorders: Secondary | ICD-10-CM

## 2023-05-19 DIAGNOSIS — Z130 Encounter for screening for diseases of the blood and blood-forming organs and certain disorders involving the immune mechanism: Secondary | ICD-10-CM

## 2023-05-19 DIAGNOSIS — Z1329 Encounter for screening for other suspected endocrine disorder: Secondary | ICD-10-CM | POA: Diagnosis not present

## 2023-05-19 DIAGNOSIS — F909 Attention-deficit hyperactivity disorder, unspecified type: Secondary | ICD-10-CM

## 2023-05-19 DIAGNOSIS — K5100 Ulcerative (chronic) pancolitis without complications: Secondary | ICD-10-CM

## 2023-05-19 NOTE — Progress Notes (Signed)
Complete physical exam  Patient: Christian Cardenas   DOB: [birth year]   27 y.o. Male  MRN: [id number]  Subjective:    Chief Complaint  Patient presents with   Annual Exam    Christian Cardenas is a 27 y.o. male who presents today for a complete physical exam. He reports consuming a general diet.  He tries to walk for 20 minutes at work when he can and also do some strength training.  He generally feels well. He reports sleeping fairly well. He does not have additional problems to discuss today.   He has started seeing Avelina Laine for treatment of ADHD. He has started a stimulant medication for this. He has found this beneficial without side effects.   Sees GI for UC. On [facility]. Reports well controlled overall. Denies rectal bleeding or weight loss.   Most recent fall risk assessment:    05/19/2023   10:48 AM  Fall Risk   Falls in the past year? 0     Most recent depression screenings:    02/09/2023   10:23 AM 01/28/2022    2:21 PM  PHQ 2/9 Scores  PHQ - 2 Score 0 0  PHQ- 9 Score 2 0    Vision:Not within last year  and Dental: No current dental problems and Receives regular dental care  Past Medical History:  Diagnosis Date   Clostridioides difficile infection 05/2018   Eczema    Ulcerative colitis [facility])       Patient Care Team: Gabriel Earing, FNP as PCP - General (Family Medicine)   Outpatient Medications Prior to Visit  Medication Sig   amphetamine-dextroamphetamine (ADDERALL) 10 MG tablet Take 1 tablet (10 mg total) by mouth daily with breakfast.   hydrocortisone 2.5 % cream Apply topically 2 (two) times daily.   Multiple Vitamin (MULTIVITAMIN WITH MINERALS) TABS tablet Take 1 tablet by mouth daily.   saccharomyces boulardii (FLORASTOR) 250 MG capsule Take 250 mg by mouth daily.   triamcinolone cream (KENALOG) 0.1 % Apply 1 Application topically daily as needed (for irritation).   XELJANZ XR 11 MG TB24 Take 1 tablet by mouth daily.   No facility-administered  medications prior to visit.    ROS Negative unless specially indicated above in HPI.      Objective:     BP 113/77   Pulse 79   Temp 97.7 F (36.5 C) (Temporal)   Ht 5' 5.5" (1.664 m)   Wt 151 lb 3.2 oz (68.6 kg)   SpO2 97%   BMI 24.78 kg/m  Wt Readings from Last 3 Encounters:  05/19/23 151 lb 3.2 oz (68.6 kg)  02/09/23 146 lb (66.2 kg)  01/28/22 129 lb (58.5 kg)      Physical Exam Vitals and nursing note reviewed.  Constitutional:      General: He is not in acute distress.    Appearance: Normal appearance. He is not ill-appearing, toxic-appearing or diaphoretic.  HENT:     Head: Normocephalic.     Right Ear: Tympanic membrane, ear canal and external ear normal.     Left Ear: Tympanic membrane, ear canal and external ear normal.     Nose: Nose normal.     Mouth/Throat:     Mouth: Mucous membranes are moist.     Pharynx: Oropharynx is clear.  Eyes:     Extraocular Movements: Extraocular movements intact.     Conjunctiva/sclera: Conjunctivae normal.     Pupils: Pupils are equal, round, and reactive to light.  Cardiovascular:  Rate and Rhythm: Normal rate and regular rhythm.     Pulses: Normal pulses.     Heart sounds: Normal heart sounds. No murmur heard.    No friction rub. No gallop.  Pulmonary:     Effort: Pulmonary effort is normal.     Breath sounds: Normal breath sounds.  Abdominal:     General: Bowel sounds are normal. There is no distension.     Palpations: Abdomen is soft. There is no mass.     Tenderness: There is no abdominal tenderness. There is no guarding.  Musculoskeletal:        General: No swelling. Normal range of motion.     Cervical back: Normal range of motion and neck supple. No tenderness.     Right lower leg: No edema.     Left lower leg: No edema.  Skin:    General: Skin is warm and dry.     Capillary Refill: Capillary refill takes less than 2 seconds.     Findings: Rash (eczema present) present.  Neurological:     General:  No focal deficit present.     Mental Status: He is alert and oriented to person, place, and time.     Motor: No weakness.     Gait: Gait normal.  Psychiatric:        Mood and Affect: Mood normal.        Behavior: Behavior normal.        Thought Content: Thought content normal.      No results found for any visits on 05/19/23.     Assessment & Plan:    Routine Health Maintenance and Physical Exam  Christian Cardenas was seen today for annual exam.  Diagnoses and all orders for this visit:  Routine general medical examination at a health care facility  Ulcerative pancolitis [facility]) Managed by GI. On [facility].   Adult ADHD Managed by [facility].   Mixed hyperlipidemia Fasting labs pending.  -     Lipid panel  Screening for endocrine, metabolic and immunity disorder -     CBC with Differential/Platelet -     CMP14+EGFR -     TSH   Immunization History  Administered Date(s) Administered   DTaP 09/02/1993, 11/04/1993, 01/28/1994, 09/09/1994   HIB (PRP-OMP) 09/02/1993, 11/04/1993, 01/28/1994, 09/09/1994   Hepatitis A 12/03/2005, 12/12/2008   Hepatitis B 1994/03/09, 09/02/1993, 11/04/1993   IPV 09/02/1993, 11/04/1993, 01/28/1994, 06/12/1998   MMR 09/09/1994, 06/12/1998   Moderna Sars-Covid-2 Vaccination 08/31/2019, 10/02/2019   Pneumococcal Conjugate-13 11/15/2017   Pneumococcal Polysaccharide-23 01/03/2018   Tdap 12/03/2005   Varicella 11/03/1998    Health Maintenance  Topic Date Due   INFLUENZA VACCINE  08/29/2023 (Originally 12/30/2022)   COVID-19 Vaccine (3 - 2024-25 season) 02/25/2024 (Originally 01/30/2023)   DTaP/Tdap/Td (6 - Td or Tdap) 05/18/2024 (Originally 12/04/2015)   Hepatitis C Screening  Completed   HIV Screening  Completed   HPV VACCINES  Aged Out    Discussed health benefits of physical activity, and encouraged him to engage in regular exercise appropriate for his age and condition.  Problem List Items Addressed This Visit       Digestive   Ulcerative pancolitis  (HCC)     Other   Adult ADHD   Other Visit Diagnoses       Routine general medical examination at a health care facility    -  Primary     Mixed hyperlipidemia       Relevant Orders   Lipid panel  Screening for endocrine, metabolic and immunity disorder       Relevant Orders   CBC with Differential/Platelet   CMP14+EGFR   TSH      Return in 1 year (on 05/18/2024).   The patient indicates understanding of these issues and agrees with the plan.   Gabriel Earing, FNP

## 2023-05-19 NOTE — Patient Instructions (Signed)
Health Maintenance, Male Adopting a healthy lifestyle and getting preventive care are important in promoting health and wellness. Ask your health care provider about: The right schedule for you to have regular tests and exams. Things you can do on your own to prevent diseases and keep yourself healthy. What should I know about diet, weight, and exercise? Eat a healthy diet  Eat a diet that includes plenty of vegetables, fruits, low-fat dairy products, and lean protein. Do not eat a lot of foods that are high in solid fats, added sugars, or sodium. Maintain a healthy weight Body mass index (BMI) is a measurement that can be used to identify possible weight problems. It estimates body fat based on height and weight. Your health care provider can help determine your BMI and help you achieve or maintain a healthy weight. Get regular exercise Get regular exercise. This is one of the most important things you can do for your health. Most adults should: Exercise for at least 150 minutes each week. The exercise should increase your heart rate and make you sweat (moderate-intensity exercise). Do strengthening exercises at least twice a week. This is in addition to the moderate-intensity exercise. Spend less time sitting. Even light physical activity can be beneficial. Watch cholesterol and blood lipids Have your blood tested for lipids and cholesterol at 29 years of age, then have this test every 5 years. You may need to have your cholesterol levels checked more often if: Your lipid or cholesterol levels are high. You are older than 29 years of age. You are at high risk for heart disease. What should I know about cancer screening? Many types of cancers can be detected early and may often be prevented. Depending on your health history and family history, you may need to have cancer screening at various ages. This may include screening for: Colorectal cancer. Prostate cancer. Skin cancer. Lung  cancer. What should I know about heart disease, diabetes, and high blood pressure? Blood pressure and heart disease High blood pressure causes heart disease and increases the risk of stroke. This is more likely to develop in people who have high blood pressure readings or are overweight. Talk with your health care provider about your target blood pressure readings. Have your blood pressure checked: Every 3-5 years if you are 18-39 years of age. Every year if you are 40 years old or older. If you are between the ages of 65 and 75 and are a current or former smoker, ask your health care provider if you should have a one-time screening for abdominal aortic aneurysm (AAA). Diabetes Have regular diabetes screenings. This checks your fasting blood sugar level. Have the screening done: Once every three years after age 45 if you are at a normal weight and have a low risk for diabetes. More often and at a younger age if you are overweight or have a high risk for diabetes. What should I know about preventing infection? Hepatitis B If you have a higher risk for hepatitis B, you should be screened for this virus. Talk with your health care provider to find out if you are at risk for hepatitis B infection. Hepatitis C Blood testing is recommended for: Everyone born from 1945 through 1965. Anyone with known risk factors for hepatitis C. Sexually transmitted infections (STIs) You should be screened each year for STIs, including gonorrhea and chlamydia, if: You are sexually active and are younger than 29 years of age. You are older than 29 years of age and your   health care provider tells you that you are at risk for this type of infection. Your sexual activity has changed since you were last screened, and you are at increased risk for chlamydia or gonorrhea. Ask your health care provider if you are at risk. Ask your health care provider about whether you are at high risk for HIV. Your health care provider  may recommend a prescription medicine to help prevent HIV infection. If you choose to take medicine to prevent HIV, you should first get tested for HIV. You should then be tested every 3 months for as long as you are taking the medicine. Follow these instructions at home: Alcohol use Do not drink alcohol if your health care provider tells you not to drink. If you drink alcohol: Limit how much you have to 0-2 drinks a day. Know how much alcohol is in your drink. In the [place] one drink equals one 12 oz bottle of beer (355 mL), one 5 oz glass of wine (148 mL), or one 1 oz glass of hard liquor (44 mL). Lifestyle Do not use any products that contain nicotine or tobacco. These products include cigarettes, chewing tobacco, and vaping devices, such as e-cigarettes. If you need help quitting, ask your health care provider. Do not use street drugs. Do not share needles. Ask your health care provider for help if you need support or information about quitting drugs. General instructions Schedule regular health, dental, and eye exams. Stay current with your vaccines. Tell your health care provider if: You often feel depressed. You have ever been abused or do not feel safe at home. Summary Adopting a healthy lifestyle and getting preventive care are important in promoting health and wellness. Follow your health care provider's instructions about healthy diet, exercising, and getting tested or screened for diseases. Follow your health care provider's instructions on monitoring your cholesterol and blood pressure. This information is not intended to replace advice given to you by your health care provider. Make sure you discuss any questions you have with your health care provider. Document Revised: 10/06/2020 Document Reviewed: 10/06/2020 Elsevier Patient Education  2024 [employer]  

## 2023-05-19 NOTE — Telephone Encounter (Signed)
  Chief Complaint: Medication question  Disposition: [] ED /[] Urgent Care (no appt availability in office) / [] Appointment(In office/virtual)/ []  [facility]/ [x] Home Care/ [] Refused Recommended Disposition /[] [facility]/ []  Follow-up with PCP Additional Notes: Pt called with question about daily medication, Adderall. Pt states he has fasting labs this morning and he was unsure if he should take his daily medications. Pt states provider advised pt to take medication with food to avoid side effects and to skip dose if it is after 1000 am. Pt states his appt is at 1030 today. This RN advised pt to follow instructions from PCP. Pt verbalized understanding, denies further questions. Alerting PCP for review.   Copied from CRM [id number]. Topic: Clinical - Medication Question >> May 19, 2023  9:12 AM Louie Casa B wrote: Reason for CRM: pt called in wanting to know if it would be ok to take his rx with him having a physical today Reason for Disposition  Caller has medicine question only, adult not sick, AND triager answers question  Answer Assessment - Initial Assessment Questions 1. NAME of MEDICINE: "What medicine(s) are you calling about?"     Daily medications- adderall 2. QUESTION: "What is your question?" (e.g., double dose of medicine, side effect)     Can I take it with fasting labs. 3. PRESCRIBER: "Who prescribed the medicine?" Reason: if prescribed by specialist, call should be referred to that group.     PCP 4. SYMPTOMS: "Do you have any symptoms?" If Yes, ask: "What symptoms are you having?"  "How bad are the symptoms (e.g., mild, moderate, severe)     Denies  Protocols used: Medication Question Call-A-AH

## 2023-05-20 LAB — CBC WITH DIFFERENTIAL/PLATELET
Basophils Absolute: 0 10*3/uL (ref 0.0–0.2)
Basos: 0 %
EOS (ABSOLUTE): 0.5 10*3/uL — ABNORMAL HIGH (ref 0.0–0.4)
Eos: 10 %
Hematocrit: 43.8 % (ref 37.5–51.0)
Hemoglobin: 14.4 g/dL (ref 13.0–17.7)
Immature Grans (Abs): 0 10*3/uL (ref 0.0–0.1)
Immature Granulocytes: 0 %
Lymphocytes Absolute: 1.3 10*3/uL (ref 0.7–3.1)
Lymphs: 27 %
MCH: 29.8 pg (ref 26.6–33.0)
MCHC: 32.9 g/dL (ref 31.5–35.7)
MCV: 91 fL (ref 79–97)
Monocytes Absolute: 0.4 10*3/uL (ref 0.1–0.9)
Monocytes: 9 %
Neutrophils Absolute: 2.5 10*3/uL (ref 1.4–7.0)
Neutrophils: 54 %
Platelets: 198 10*3/uL (ref 150–450)
RBC: 4.83 x10E6/uL (ref 4.14–5.80)
RDW: 13 % (ref 11.6–15.4)
WBC: 4.7 10*3/uL (ref 3.4–10.8)

## 2023-05-20 LAB — CMP14+EGFR
ALT: 19 [IU]/L (ref 0–44)
AST: 23 [IU]/L (ref 0–40)
Albumin: 4.8 g/dL (ref 4.3–5.2)
Alkaline Phosphatase: 62 [IU]/L (ref 44–121)
BUN/Creatinine Ratio: 14 (ref 9–20)
BUN: 13 mg/dL (ref 6–20)
Bilirubin Total: 0.6 mg/dL (ref 0.0–1.2)
CO2: 24 mmol/L (ref 20–29)
Calcium: 9.8 mg/dL (ref 8.7–10.2)
Chloride: 100 mmol/L (ref 96–106)
Creatinine, Ser: 0.93 mg/dL (ref 0.76–1.27)
Globulin, Total: 2.6 g/dL (ref 1.5–4.5)
Glucose: 83 mg/dL (ref 70–99)
Potassium: 4.2 mmol/L (ref 3.5–5.2)
Sodium: 140 mmol/L (ref 134–144)
Total Protein: 7.4 g/dL (ref 6.0–8.5)
eGFR: 114 mL/min/{1.73_m2} (ref >59)

## 2023-05-20 LAB — LIPID PANEL
Chol/HDL Ratio: 6.5 {ratio} — ABNORMAL HIGH (ref 0.0–5.0)
Cholesterol, Total: 242 mg/dL — ABNORMAL HIGH (ref 100–199)
HDL: 37 mg/dL — ABNORMAL LOW (ref >39)
LDL Chol Calc (NIH): 158 mg/dL — ABNORMAL HIGH (ref 0–99)
Triglycerides: 255 mg/dL — ABNORMAL HIGH (ref 0–149)
VLDL Cholesterol Cal: 47 mg/dL — ABNORMAL HIGH (ref 5–40)

## 2023-05-20 LAB — TSH: TSH: 4.33 u[IU]/mL (ref 0.450–4.500)

## 2023-06-09 ENCOUNTER — Telehealth: Payer: Self-pay | Admitting: Behavioral Health

## 2023-06-09 ENCOUNTER — Other Ambulatory Visit: Payer: Self-pay

## 2023-06-09 DIAGNOSIS — F902 Attention-deficit hyperactivity disorder, combined type: Secondary | ICD-10-CM

## 2023-06-09 MED ORDER — AMPHETAMINE-DEXTROAMPHETAMINE 10 MG PO TABS: 10.0000 mg | ORAL_TABLET | Freq: Every day | ORAL | 0 refills | Status: DC

## 2023-06-09 NOTE — Telephone Encounter (Signed)
 PT called and asked for his adderall 10 mg to be refilled. [facility]acy is [facility] in [place]

## 2023-06-09 NOTE — Telephone Encounter (Signed)
 Pended Adderall 10 mg to [facility] in [place].

## 2023-07-19 ENCOUNTER — Encounter: Payer: Self-pay | Admitting: Behavioral Health

## 2023-07-19 ENCOUNTER — Ambulatory Visit: Payer: BC Managed Care – PPO | Admitting: Behavioral Health

## 2023-07-19 DIAGNOSIS — F902 Attention-deficit hyperactivity disorder, combined type: Secondary | ICD-10-CM

## 2023-07-19 MED ORDER — AMPHETAMINE-DEXTROAMPHETAMINE 10 MG PO TABS: 10.0000 mg | ORAL_TABLET | Freq: Every day | ORAL | 0 refills | Status: DC

## 2023-07-19 NOTE — Progress Notes (Signed)
[facility]  Patient ID: Christian Cardenas,  MRN: [id number]  PCP: Gabriel Earing, FNP  Date of Evaluation: 07/19/2023 Time spent:30 minutes  Chief Complaint:   HISTORY/CURRENT STATUS: HPI "Christian Cardenas", 28 year old male patient was seen in this office for initial visit and to establish care. Pt was was talkative today but easily redirected. Feels like his Adderall continues to work well. He denies depression but reports his anxiety is 2/10.  He is sleeping 7 to 8 hours per night.  He is very talkative and at times difficult to focus.  He denies any history of mania, no psychosis, no auditory or visual hallucinations.  He denies SI or HI.  There is family history of bipolar disorder.  Patient feels safe and verbally contracted for safety with this Clinical research associate.   No past psychiatric medication trials Individual Medical History/ Review of Systems: Changes? :No   Allergies: Egg-derived products, Peanut butter flavoring agent (non-screening), and Peanut-containing drug products  Current Medications:  Current Outpatient Medications:    amphetamine-dextroamphetamine (ADDERALL) 10 MG tablet, Take 1 tablet (10 mg total) by mouth daily with breakfast., Disp: 30 tablet, Rfl: 0   hydrocortisone 2.5 % cream, Apply topically 2 (two) times daily., Disp: 30 g, Rfl: 1   Multiple Vitamin (MULTIVITAMIN WITH MINERALS) TABS tablet, Take 1 tablet by mouth daily., Disp: , Rfl:    saccharomyces boulardii (FLORASTOR) 250 MG capsule, Take 250 mg by mouth daily., Disp: , Rfl:    triamcinolone cream (KENALOG) 0.1 %, Apply 1 Application topically daily as needed (for irritation)., Disp: 30 g, Rfl: 1   XELJANZ XR 11 MG TB24, Take 1 tablet by mouth daily., Disp: , Rfl:  Medication Side Effects: none  Family Medical/ Social History: Changes? No  MENTAL HEALTH EXAM:  There were no vitals taken for this visit.There is no height or weight on file to calculate BMI.  General Appearance: Casual, Neat, and Well  Groomed  Eye Contact:  Good  Speech:  Clear and Coherent  Volume:  Normal  Mood:  NA  Affect:  Appropriate  Thought Process:  Coherent  Orientation:  Full (Time, Place, and Person)  Thought Content: Logical   Suicidal Thoughts:  No  Homicidal Thoughts:  No  Memory:  WNL  Judgement:  Good  Insight:  Good  Psychomotor Activity:  Normal  Concentration:  Concentration: Good  Recall:  Good  Fund of Knowledge: Good  Language: Good  Assets:  Desire for Improvement  ADL's:  Intact  Cognition: WNL  Prognosis:  Good    DIAGNOSES:    ICD-10-CM   1. Attention deficit hyperactivity disorder (ADHD), combined type  F90.2       Receiving Psychotherapy: No    RECOMMENDATIONS:   Greater than 50% of 30 min  face to face time with patient was spent on counseling and coordination of care. Discussed his continued stability and happiness with his medication. Not as talkative today and more focused. I still have strong suspicion  that mild Autism complicates dx.  Hx of Autism and Bipolar in family.  We agreed today to: To continue. Adderall 10 mg IR daily after breakfast Will report worsening symptoms or side effects promptly Will follow-up in 3 months to reassess Provided emergency contact information Rx sent to [facility] in [place] Reviewed PDMP      Joan Flores, NP

## 2023-07-28 DIAGNOSIS — K51011 Ulcerative (chronic) pancolitis with rectal bleeding: Secondary | ICD-10-CM | POA: Diagnosis not present

## 2023-08-23 DIAGNOSIS — L218 Other seborrheic dermatitis: Secondary | ICD-10-CM | POA: Diagnosis not present

## 2023-09-15 ENCOUNTER — Telehealth: Payer: Self-pay | Admitting: Behavioral Health

## 2023-09-15 ENCOUNTER — Other Ambulatory Visit: Payer: Self-pay

## 2023-09-15 DIAGNOSIS — F902 Attention-deficit hyperactivity disorder, combined type: Secondary | ICD-10-CM

## 2023-09-15 MED ORDER — AMPHETAMINE-DEXTROAMPHETAMINE 10 MG PO TABS: 10.0000 mg | ORAL_TABLET | Freq: Every day | ORAL | 0 refills | Status: DC

## 2023-09-15 NOTE — Telephone Encounter (Signed)
 Pt LVM @ 1:08p requesting refill of Adderall to   [facility] - [place], [place] - [address], [place] [address] Phone: [phone]  Fax: [phone]   He said he has enough until Wed.  Next appt 5/19

## 2023-09-15 NOTE — Telephone Encounter (Signed)
 Pended Adderall 10 mg

## 2023-09-27 DIAGNOSIS — Z008 Encounter for other general examination: Secondary | ICD-10-CM | POA: Diagnosis not present

## 2023-09-27 DIAGNOSIS — K51011 Ulcerative (chronic) pancolitis with rectal bleeding: Secondary | ICD-10-CM | POA: Diagnosis not present

## 2023-10-17 ENCOUNTER — Ambulatory Visit: Payer: BC Managed Care – PPO | Admitting: Behavioral Health

## 2023-10-17 ENCOUNTER — Telehealth: Admitting: Behavioral Health

## 2023-10-17 DIAGNOSIS — F902 Attention-deficit hyperactivity disorder, combined type: Secondary | ICD-10-CM

## 2023-10-18 ENCOUNTER — Encounter: Payer: Self-pay | Admitting: Behavioral Health

## 2023-10-18 MED ORDER — AMPHETAMINE-DEXTROAMPHETAMINE 10 MG PO TABS: 10.0000 mg | ORAL_TABLET | Freq: Every day | ORAL | 0 refills | Status: DC

## 2023-10-18 NOTE — Progress Notes (Signed)
 Christian Cardenas [id number] Feb 19, 1996 28 y.o.  Virtual Visit via Video Note  I connected with pt @ on 10/18/23 at 10:30 AM EDT by a video enabled telemedicine application and verified that I am speaking with the correct person using two identifiers.   I discussed the limitations of evaluation and management by telemedicine and the availability of in person appointments. The patient expressed understanding and agreed to proceed.  I discussed the assessment and treatment plan with the patient. The patient was provided an opportunity to ask questions and all were answered. The patient agreed with the plan and demonstrated an understanding of the instructions.   The patient was advised to call back or seek an in-person evaluation if the symptoms worsen or if the condition fails to improve as anticipated.  I provided 30 minutes of non-face-to-face time during this encounter.  The patient was located at home.  The provider was located at [facility].   Christian Renshaw, NP   Subjective:   Patient ID:  Christian Cardenas is a 28 y.o. (DOB [birth year]) male.  Chief Complaint: No chief complaint on file.   HPI "Christian Cardenas", 28 year old male patient was seen in this office via video visit for follow up and medication management. Report no psychosocial changes this visit. Pt was was talkative today but easily redirected. Feels like his Adderall continues to work well. He denies depression but reports his anxiety is 2/10.  He is sleeping 7 to 8 hours per night.  He is very talkative and at times difficult to focus.  He denies any history of mania, no psychosis, no auditory or visual hallucinations.  He denies SI or HI.  There is family history of bipolar disorder.  Patient feels safe and verbally contracted for safety with this Clinical research associate.   No past psychiatric medication trials   Review of Systems:  Review of Systems  Constitutional: Negative.   Allergic/Immunologic: Negative.   Neurological:  Negative.   Psychiatric/Behavioral: Negative.      Medications: I have reviewed the patient's current medications.  Current Outpatient Medications  Medication Sig Dispense Refill   amphetamine -dextroamphetamine  (ADDERALL) 10 MG tablet Take 1 tablet (10 mg total) by mouth daily with breakfast. 30 tablet 0   hydrocortisone  2.5 % cream Apply topically 2 (two) times daily. 30 g 1   Multiple Vitamin (MULTIVITAMIN WITH MINERALS) TABS tablet Take 1 tablet by mouth daily.     saccharomyces boulardii (FLORASTOR) 250 MG capsule Take 250 mg by mouth daily.     triamcinolone  cream (KENALOG ) 0.1 % Apply 1 Application topically daily as needed (for irritation). 30 g 1   XELJANZ XR 11 MG TB24 Take 1 tablet by mouth daily.     No current facility-administered medications for this visit.    Medication Side Effects: None  Allergies:  Allergies  Allergen Reactions   Egg-Derived Products Anaphylaxis    AND MAYONNAISE Eczema reaction too   Peanut Butter Flavoring Agent (Non-Screening) Anaphylaxis    ezcema issues also   Peanut-Containing Drug Products Anaphylaxis    Past Medical History:  Diagnosis Date   Clostridioides difficile infection 05/2018   Eczema    Ulcerative colitis (HCC)     Family History  Problem Relation Age of Onset   Diabetes Mother    Depression Mother    Asthma Mother    ADD / ADHD Mother    Seizures Mother    Bipolar disorder Mother    Diabetes Father    Hyperlipidemia Father    ADD /  ADHD Brother    Bipolar disorder Brother    Hearing loss Brother    Anxiety disorder Brother    Colon cancer Maternal Grandmother    Stroke Maternal Grandmother    Liver cancer Maternal Grandfather    Esophageal cancer Neg Hx    Rectal cancer Neg Hx    Stomach cancer Neg Hx     Social History   Socioeconomic History   Marital status: Single    Spouse name: Not on file   Number of children: 0   Years of education: 16   Highest education level: Bachelor's degree (e.g.,  BA, AB, BS)  Occupational History   Occupation: Herbalist    Comment: in [place]  Tobacco Use   Smoking status: Never   Smokeless tobacco: Never  Vaping Use   Vaping status: Never Used  Substance and Sexual Activity   Alcohol use: No   Drug use: No   Sexual activity: Never  Other Topics Concern   Not on file  Social History Narrative   Not on file   Social Drivers of Health   Financial Resource Strain: Not on file  Food Insecurity: Not on file  Transportation Needs: Not on file  Physical Activity: Not on file  Stress: Not on file  Social Connections: Not on file  Intimate Partner Violence: Not on file    Past Medical History, Surgical history, Social history, and Family history were reviewed and updated as appropriate.   Please see review of systems for further details on the patient's review from today.   Objective:   Physical Exam:  There were no vitals taken for this visit.  Physical Exam Constitutional:      General: He is not in acute distress.    Appearance: Normal appearance.  Neurological:     Mental Status: He is alert and oriented to person, place, and time.     Gait: Gait normal.  Psychiatric:        Attention and Perception: Attention and perception normal. He does not perceive auditory or visual hallucinations.        Mood and Affect: Mood and affect normal. Mood is not anxious or depressed. Affect is not labile.        Speech: Speech normal.        Behavior: Behavior normal. Behavior is cooperative.        Thought Content: Thought content normal.        Cognition and Memory: Cognition and memory normal.        Judgment: Judgment normal.     Lab Review:     Component Value Date/Time   NA 140 05/19/2023 1052   K 4.2 05/19/2023 1052   CL 100 05/19/2023 1052   CO2 24 05/19/2023 1052   GLUCOSE 83 05/19/2023 1052   GLUCOSE 83 10/16/2018 1625   BUN 13 05/19/2023 1052   CREATININE 0.93 05/19/2023 1052   CALCIUM 9.8 05/19/2023 1052   PROT  7.4 05/19/2023 1052   ALBUMIN 4.8 05/19/2023 1052   AST 23 05/19/2023 1052   ALT 19 05/19/2023 1052   ALKPHOS 62 05/19/2023 1052   BILITOT 0.6 05/19/2023 1052   GFRNONAA >60 07/30/2018 0559   GFRAA >60 07/30/2018 0559       Component Value Date/Time   WBC 4.7 05/19/2023 1052   WBC 5.9 01/02/2019 1340   RBC 4.83 05/19/2023 1052   RBC 4.69 01/02/2019 1340   HGB 14.4 05/19/2023 1052   HCT 43.8 05/19/2023 1052  PLT 198 05/19/2023 1052   MCV 91 05/19/2023 1052   MCH 29.8 05/19/2023 1052   MCH 24.6 (L) 07/30/2018 0559   MCHC 32.9 05/19/2023 1052   MCHC 33.6 01/02/2019 1340   RDW 13.0 05/19/2023 1052   LYMPHSABS 1.3 05/19/2023 1052   MONOABS 0.7 01/02/2019 1340   EOSABS 0.5 (H) 05/19/2023 1052   BASOSABS 0.0 05/19/2023 1052    No results found for: "POCLITH", "LITHIUM"   No results found for: "PHENYTOIN", "PHENOBARB", "VALPROATE", "CBMZ"   .res Assessment: Plan:    Greater than 50% of 30 min  face to face time with patient was spent on counseling and coordination of care. Discussed his continued stability and happiness with his medication. Talkative and articulate today.Happy with medication and requesting no changes today.   We agreed today to: To continue. Adderall 10 mg IR daily after breakfast Will report worsening symptoms or side effects promptly Will follow-up in 3 months to reassess Provided emergency contact information Rx sent to [facility] in [place] Reviewed PDMP      There are no diagnoses linked to this encounter.   Please see After Visit Summary for patient specific instructions.  Future Appointments  Date Time Provider Department Center  01/18/2024 10:30 AM Christian Renshaw, NP [facility] None  06/01/2024 10:30 AM Albertha Huger, FNP [facility] None    No orders of the defined types were placed in this encounter.     -------------------------------

## 2023-12-23 ENCOUNTER — Telehealth: Payer: Self-pay | Admitting: Behavioral Health

## 2023-12-23 ENCOUNTER — Other Ambulatory Visit: Payer: Self-pay

## 2023-12-23 DIAGNOSIS — F902 Attention-deficit hyperactivity disorder, combined type: Secondary | ICD-10-CM

## 2023-12-23 MED ORDER — AMPHETAMINE-DEXTROAMPHETAMINE 10 MG PO TABS: 10.0000 mg | ORAL_TABLET | Freq: Every day | ORAL | 0 refills | Status: DC

## 2023-12-23 NOTE — Telephone Encounter (Signed)
 Pended.

## 2023-12-23 NOTE — Telephone Encounter (Signed)
 Pt called asking for a refill on his adderall 10 mg. [facility]acy [facility] in [place]. Next appt 01/18/24

## 2023-12-29 DIAGNOSIS — K51011 Ulcerative (chronic) pancolitis with rectal bleeding: Secondary | ICD-10-CM | POA: Diagnosis not present

## 2024-01-04 DIAGNOSIS — K51011 Ulcerative (chronic) pancolitis with rectal bleeding: Secondary | ICD-10-CM | POA: Diagnosis not present

## 2024-01-18 ENCOUNTER — Telehealth (INDEPENDENT_AMBULATORY_CARE_PROVIDER_SITE_OTHER): Admitting: Behavioral Health

## 2024-01-18 ENCOUNTER — Encounter: Payer: Self-pay | Admitting: Behavioral Health

## 2024-01-18 DIAGNOSIS — F902 Attention-deficit hyperactivity disorder, combined type: Secondary | ICD-10-CM

## 2024-01-18 DIAGNOSIS — F909 Attention-deficit hyperactivity disorder, unspecified type: Secondary | ICD-10-CM | POA: Diagnosis not present

## 2024-01-18 NOTE — Progress Notes (Signed)
 [facility]  Patient ID: Christian Cardenas,  MRN: [id number]  PCP: Joesph Annabella HERO, FNP  Date of Evaluation: 01/18/2024 Time spent:30 minutes   Virtual Visit via Video Note   I connected with pt @ on 01/18/24 at 10:30 AM EDT by a video enabled telemedicine application and verified that I am speaking with the correct person using two identifiers.   I discussed the limitations of evaluation and management by telemedicine and the availability of in person appointments. The patient expressed understanding and agreed to proceed.   I discussed the assessment and treatment plan with the patient. The patient was provided an opportunity to ask questions and all were answered. The patient agreed with the plan and demonstrated an understanding of the instructions.   The patient was advised to call back or seek an in-person evaluation if the symptoms worsen or if the condition fails to improve as anticipated.   I provided 30 minutes of non-face-to-face time during this encounter.  The patient was located at home.  The provider was located at [facility].     Redell DELENA Pizza, NP      Chief Complaint:  Chief Complaint   ADHD; Follow-up; Medication Refill; Medication Problem; Patient Education     HISTORY/CURRENT STATUS: HPI  Christian Cardenas, 28 year old male patient was seen in this office via video visit for follow up and medication management. Report no psychosocial changes this visit. Pt was was talkative today but easily redirected. Feels like his Adderall continues to work but wondering if it is starting to decline some. He would like to continue with current dose for now and will call office if further experiencing some deficits.  He denies depression but reports his anxiety is 2/10.  He is sleeping 7 to 8 hours per night.  He is very talkative and at times difficult to focus.  He denies any history of mania, no psychosis, no auditory or visual hallucinations.  He denies  SI or HI.  There is family history of bipolar disorder.  Patient feels safe and verbally contracted for safety with this Clinical research associate.   No past psychiatric medication trials    Individual Medical History/ Review of Systems: Changes? :No   Allergies: Egg-derived products, Peanut butter flavoring agent (non-screening), and Peanut-containing drug products  Current Medications:  Current Outpatient Medications:    amphetamine -dextroamphetamine  (ADDERALL) 10 MG tablet, Take 1 tablet (10 mg total) by mouth daily with breakfast., Disp: 30 tablet, Rfl: 0   hydrocortisone  2.5 % cream, Apply topically 2 (two) times daily., Disp: 30 g, Rfl: 1   Multiple Vitamin (MULTIVITAMIN WITH MINERALS) TABS tablet, Take 1 tablet by mouth daily., Disp: , Rfl:    saccharomyces boulardii (FLORASTOR) 250 MG capsule, Take 250 mg by mouth daily., Disp: , Rfl:    triamcinolone  cream (KENALOG ) 0.1 %, Apply 1 Application topically daily as needed (for irritation)., Disp: 30 g, Rfl: 1   XELJANZ XR 11 MG TB24, Take 1 tablet by mouth daily., Disp: , Rfl:  Medication Side Effects: none  Family Medical/ Social History: Changes? No  MENTAL HEALTH EXAM:  There were no vitals taken for this visit.There is no height or weight on file to calculate BMI.  General Appearance: Casual  Eye Contact:  Good  Speech:  Clear and Coherent  Volume:  Normal  Mood:  NA  Affect:  Appropriate  Thought Process:  Coherent  Orientation:  Full (Time, Place, and Person)  Thought Content: Logical   Suicidal Thoughts:  No  Homicidal Thoughts:  No  Memory:  WNL  Judgement:  Good  Insight:  Good  Psychomotor Activity:  Normal  Concentration:  Concentration: Good  Recall:  Good  Fund of Knowledge: Good  Language: Good  Assets:  Desire for Improvement  ADL's:  Intact  Cognition: WNL  Prognosis:  Good    DIAGNOSES: No diagnosis found.  Receiving Psychotherapy: No    RECOMMENDATIONS:   Greater than 50% of 30 min  video visit time with  patient was spent on counseling and coordination of care. Discussed his continued stability with Adderall but he is wondering if medication effectiveness is starting to wain. Talkative and articulate today.    We agreed today to: To continue. Adderall 10 mg IR daily after breakfast Will report worsening symptoms or side effects promptly Will follow-up in 3 months to reassess Provided emergency contact information Rx sent to [facility] in [place] Reviewed PDMP    Redell DELENA Pizza, NP

## 2024-02-14 ENCOUNTER — Other Ambulatory Visit: Payer: Self-pay

## 2024-02-14 ENCOUNTER — Telehealth: Payer: Self-pay | Admitting: Behavioral Health

## 2024-02-14 DIAGNOSIS — F902 Attention-deficit hyperactivity disorder, combined type: Secondary | ICD-10-CM

## 2024-02-14 MED ORDER — AMPHETAMINE-DEXTROAMPHETAMINE 10 MG PO TABS: 10.0000 mg | ORAL_TABLET | Freq: Every day | ORAL | 0 refills | Status: DC

## 2024-02-14 NOTE — Telephone Encounter (Signed)
 Pended

## 2024-02-14 NOTE — Telephone Encounter (Signed)
 Pt called asking for a refill on his adderall 10 mg. Pharmacy is [facility] in [place]. Next appt in november

## 2024-02-17 ENCOUNTER — Other Ambulatory Visit: Payer: Self-pay

## 2024-02-17 ENCOUNTER — Telehealth: Payer: Self-pay | Admitting: Behavioral Health

## 2024-02-17 DIAGNOSIS — F902 Attention-deficit hyperactivity disorder, combined type: Secondary | ICD-10-CM

## 2024-02-17 MED ORDER — AMPHETAMINE-DEXTROAMPHETAMINE 10 MG PO TABS: 10.0000 mg | ORAL_TABLET | Freq: Every day | ORAL | 0 refills | Status: DC

## 2024-02-17 NOTE — Telephone Encounter (Signed)
 Next visit is 04/19/94. Christian Cardenas went to [facility] in [place], [place] and picked up 2 prescriptions for Adderall 10 mg. Patient noticed that the two prescriptions were dated for Oct and Nov of 2025.  Do they need to be re-written for correct dates??  [facility] [address], [place] - [address]   Phone: [phone]  Fax: [phone]

## 2024-02-17 NOTE — Telephone Encounter (Signed)
 Pharmacy said Sept Rx came across twice so they canceled both. They do have Rx for Oct and Nov. Repended Sept Rx.

## 2024-04-10 ENCOUNTER — Telehealth: Payer: Self-pay | Admitting: Behavioral Health

## 2024-04-10 NOTE — Telephone Encounter (Signed)
Duplicate RF request.

## 2024-04-17 ENCOUNTER — Telehealth (INDEPENDENT_AMBULATORY_CARE_PROVIDER_SITE_OTHER): Admitting: Behavioral Health

## 2024-04-17 ENCOUNTER — Encounter: Payer: Self-pay | Admitting: Behavioral Health

## 2024-04-17 DIAGNOSIS — F902 Attention-deficit hyperactivity disorder, combined type: Secondary | ICD-10-CM | POA: Diagnosis not present

## 2024-04-17 NOTE — Progress Notes (Signed)
 Christian Cardenas [id number] 05/12/1996 28 y.o.  Virtual Visit via Video Note  I connected with pt @ on 04/17/24 at  2:00 PM EST by a video enabled telemedicine application and verified that I am speaking with the correct person using two identifiers.   I discussed the limitations of evaluation and management by telemedicine and the availability of in person appointments. The patient expressed understanding and agreed to proceed.  I discussed the assessment and treatment plan with the patient. The patient was provided an opportunity to ask questions and all were answered. The patient agreed with the plan and demonstrated an understanding of the instructions.   The patient was advised to call back or seek an in-person evaluation if the symptoms worsen or if the condition fails to improve as anticipated.  I provided 20 minutes of non-face-to-face time during this encounter.  The patient was located at home.  The provider was located at [facility].   Christian DELENA Pizza, Christian Cardenas   Subjective:   Patient ID:  Christian Cardenas is a 28 y.o. (DOB 1995-10-15) male.  Chief Complaint:  Chief Complaint  Patient presents with   Follow-up   Medication Refill   Patient Education   ADHD    HPI  Christian Cardenas, 28 year old male patient was seen in this office via video visit for follow up and medication management. Report no psychosocial changes this visit.  Patient reports continued efficacy of medication.  He tries to take 1 to 2 days break from the med to prevent tolerance.  He denies depression but reports his anxiety is 2/10.  He is sleeping 7 to 8 hours per night.  He is very talkative and at times difficult to focus.  He denies any history of mania, no psychosis, no auditory or visual hallucinations.  He denies SI or HI.  There is family history of bipolar disorder.  Patient feels safe and verbally contracted for safety with this clinical research associate.   No past psychiatric medication trials  Review of Systems:   Review of Systems  Medications: I have reviewed the patient's current medications.  Current Outpatient Medications  Medication Sig Dispense Refill   amphetamine -dextroamphetamine  (ADDERALL) 10 MG tablet Take 1 tablet (10 mg total) by mouth daily with breakfast. 30 tablet 0   amphetamine -dextroamphetamine  (ADDERALL) 10 MG tablet Take 1 tablet (10 mg total) by mouth daily with breakfast. 30 tablet 0   amphetamine -dextroamphetamine  (ADDERALL) 10 MG tablet Take 1 tablet (10 mg total) by mouth daily with breakfast. 30 tablet 0   hydrocortisone  2.5 % cream Apply topically 2 (two) times daily. 30 g 1   Multiple Vitamin (MULTIVITAMIN WITH MINERALS) TABS tablet Take 1 tablet by mouth daily.     saccharomyces boulardii (FLORASTOR) 250 MG capsule Take 250 mg by mouth daily.     triamcinolone  cream (KENALOG ) 0.1 % Apply 1 Application topically daily as needed (for irritation). 30 g 1   XELJANZ XR 11 MG TB24 Take 1 tablet by mouth daily.     No current facility-administered medications for this visit.    Medication Side Effects: None  Allergies:  Allergies  Allergen Reactions   Egg Protein-Containing Drug Products Anaphylaxis    AND MAYONNAISE Eczema reaction too   Peanut Butter Flavoring Agent (Non-Screening) Anaphylaxis    ezcema issues also   Peanut-Containing Drug Products Anaphylaxis    Past Medical History:  Diagnosis Date   Clostridioides difficile infection 05/2018   Eczema    Ulcerative colitis (HCC)     Family History  Problem  Relation Age of Onset   Diabetes Mother    Depression Mother    Asthma Mother    ADD / ADHD Mother    Seizures Mother    Bipolar disorder Mother    Diabetes Father    Hyperlipidemia Father    ADD / ADHD Brother    Bipolar disorder Brother    Hearing loss Brother    Anxiety disorder Brother    Colon cancer Maternal Grandmother    Stroke Maternal Grandmother    Liver cancer Maternal Grandfather    Esophageal cancer Neg Hx    Rectal cancer  Neg Hx    Stomach cancer Neg Hx     Social History   Socioeconomic History   Marital status: Single    Spouse name: Not on file   Number of children: 0   Years of education: 16   Highest education level: Bachelor's degree (e.g., BA, AB, BS)  Occupational History   Occupation: Herbalist    Comment: in [place]  Tobacco Use   Smoking status: Never   Smokeless tobacco: Never  Vaping Use   Vaping status: Never Used  Substance and Sexual Activity   Alcohol use: No   Drug use: No   Sexual activity: Never  Other Topics Concern   Not on file  Social History Narrative   Not on file   Social Drivers of Health   Financial Resource Strain: Not on file  Food Insecurity: Not on file  Transportation Needs: Not on file  Physical Activity: Not on file  Stress: Not on file  Social Connections: Not on file  Intimate Partner Violence: Not on file    Past Medical History, Surgical history, Social history, and Family history were reviewed and updated as appropriate.   Please see review of systems for further details on the patient's review from today.   Objective:   Physical Exam:  There were no vitals taken for this visit.  Physical Exam  Lab Review:     Component Value Date/Time   NA 140 05/19/2023 1052   K 4.2 05/19/2023 1052   CL 100 05/19/2023 1052   CO2 24 05/19/2023 1052   GLUCOSE 83 05/19/2023 1052   GLUCOSE 83 10/16/2018 1625   BUN 13 05/19/2023 1052   CREATININE 0.93 05/19/2023 1052   CALCIUM 9.8 05/19/2023 1052   PROT 7.4 05/19/2023 1052   ALBUMIN 4.8 05/19/2023 1052   AST 23 05/19/2023 1052   ALT 19 05/19/2023 1052   ALKPHOS 62 05/19/2023 1052   BILITOT 0.6 05/19/2023 1052   GFRNONAA >60 07/30/2018 0559   GFRAA >60 07/30/2018 0559       Component Value Date/Time   WBC 4.7 05/19/2023 1052   WBC 5.9 01/02/2019 1340   RBC 4.83 05/19/2023 1052   RBC 4.69 01/02/2019 1340   HGB 14.4 05/19/2023 1052   HCT 43.8 05/19/2023 1052   PLT 198 05/19/2023  1052   MCV 91 05/19/2023 1052   MCH 29.8 05/19/2023 1052   MCH 24.6 (L) 07/30/2018 0559   MCHC 32.9 05/19/2023 1052   MCHC 33.6 01/02/2019 1340   RDW 13.0 05/19/2023 1052   LYMPHSABS 1.3 05/19/2023 1052   MONOABS 0.7 01/02/2019 1340   EOSABS 0.5 (H) 05/19/2023 1052   BASOSABS 0.0 05/19/2023 1052    No results found for: POCLITH, LITHIUM   No results found for: PHENYTOIN, PHENOBARB, VALPROATE, CBMZ   .res Assessment: Plan:    Greater than 50% of 20 min  video visit time  with patient was spent on counseling and coordination of care.  We discussed patient's continued stability with his Adderall.  Requesting no medication changes today.  Agrees to 55-month follow-up.    We agreed today to: To continue. Adderall 10 mg IR daily after breakfast Will report worsening symptoms or side effects promptly Will follow-up in 6 months to reassess Provided emergency contact information Rx sent to [facility] in [place] Discussed potential benefits, risks, and side effects of stimulants with patient to include increased heart rate, palpitations, insomnia, increased anxiety, increased irritability, or decreased appetite.  Instructed patient to contact office if experiencing any significant tolerability issues.  Reviewed PDMP   Christian LABOR. Christian Traub, Christian Cardenas    Christian Cardenas was seen today for follow-up, medication refill, patient education and adhd.  Diagnoses and all orders for this visit:  Attention deficit hyperactivity disorder (ADHD), combined type     Please see After Visit Summary for patient specific instructions.  Future Appointments  Date Time Provider Department Center  06/01/2024 10:30 AM Joesph Annabella HERO, FNP [facility] [phone] [address]    No orders of the defined types were placed in this encounter.     -------------------------------

## 2024-04-19 ENCOUNTER — Telehealth: Admitting: Behavioral Health

## 2024-06-01 ENCOUNTER — Encounter: Payer: Self-pay | Admitting: Family Medicine

## 2024-06-01 ENCOUNTER — Ambulatory Visit: Payer: BC Managed Care – PPO | Admitting: Family Medicine

## 2024-06-01 VITALS — BP 110/67 | HR 95 | Temp 97.7°F | Ht 65.5 in | Wt 162.0 lb

## 2024-06-01 DIAGNOSIS — F909 Attention-deficit hyperactivity disorder, unspecified type: Secondary | ICD-10-CM | POA: Diagnosis not present

## 2024-06-01 DIAGNOSIS — Z130 Encounter for screening for diseases of the blood and blood-forming organs and certain disorders involving the immune mechanism: Secondary | ICD-10-CM

## 2024-06-01 DIAGNOSIS — K5100 Ulcerative (chronic) pancolitis without complications: Secondary | ICD-10-CM | POA: Diagnosis not present

## 2024-06-01 DIAGNOSIS — Z0001 Encounter for general adult medical examination with abnormal findings: Secondary | ICD-10-CM

## 2024-06-01 DIAGNOSIS — M25512 Pain in left shoulder: Secondary | ICD-10-CM | POA: Diagnosis not present

## 2024-06-01 DIAGNOSIS — E782 Mixed hyperlipidemia: Secondary | ICD-10-CM | POA: Diagnosis not present

## 2024-06-01 DIAGNOSIS — G8929 Other chronic pain: Secondary | ICD-10-CM

## 2024-06-01 DIAGNOSIS — M79672 Pain in left foot: Secondary | ICD-10-CM

## 2024-06-01 DIAGNOSIS — Z0000 Encounter for general adult medical examination without abnormal findings: Secondary | ICD-10-CM

## 2024-06-01 NOTE — Progress Notes (Signed)
 "  Complete physical exam  Patient: Christian Cardenas   DOB: Oct 31, 1995   28 y.o. Male  MRN: [id number]  Subjective:    Chief Complaint  Patient presents with   Annual Exam    Christian Cardenas is a 29 y.o. male who presents today for a complete physical exam. He reports consuming a general diet. He has been working with a dietitian to help with balanced diet. He tries to walk for exercise. [facility] He generally feels well. He reports sleeping fairly well. He does have additional problems to discuss today.   Foot pain - Dull, achy sensation localized along bottom of the left foot - Pain is most noticeable after waking up and during activities involving prolonged standing or walking - Supportive shoes with memory foam and Doctor Scholl's inserts have not provided significant relief - Symptoms have persisted for a few months  Left shoulder discomfort - Mild, intermittent discomfort in the left shoulder - Occurs only with certain movements or lifting activities - Not persistent; present for a few months  Attention deficit hyperactivity disorder (adhd) - Manages symptoms by being mindful of overstimulation before bedtime, which affects sleep - Treatment is effective; main challenge is maintaining discipline  Gastrointestinal follow-up - Recent follow-up with GI specialist - No significant gastrointestinal issues reported - Financial concerns regarding insurance coverage for blood work  Lifestyle and physical activity - Working on dietary improvements with a dietitian; some lapses during holidays - Engages in physical activity by walking and occasional workouts at the [facility], but not daily       Most recent fall risk assessment:    06/01/2024   10:30 AM  Fall Risk   Falls in the past year? 0     Most recent depression screenings:    06/01/2024   10:33 AM 05/19/2023   10:48 AM  PHQ 2/9 Scores  PHQ - 2 Score 0 0  PHQ- 9 Score 2 3      Data  saved with a previous flowsheet row definition        Patient Care Team: Joesph Annabella HERO, FNP as PCP - General (Family Medicine)   Show/hide medication list[1]  ROS Negative unless specially indicated above in HPI.    Objective:     BP 110/67   Pulse 95   Temp 97.7 F (36.5 C) (Temporal)   Ht 5' 5.5 (1.664 m)   Wt 162 lb (73.5 kg)   SpO2 96%   BMI 26.55 kg/m    Physical Exam Vitals and nursing note reviewed.  Constitutional:      General: He is not in acute distress.    Appearance: Normal appearance. He is not ill-appearing, toxic-appearing or diaphoretic.  HENT:     Head: Normocephalic.     Right Ear: Tympanic membrane, ear canal and external ear normal.     Left Ear: Tympanic membrane, ear canal and external ear normal.     Nose: Nose normal.     Mouth/Throat:     Mouth: Mucous membranes are moist.     Pharynx: Oropharynx is clear.  Eyes:     Extraocular Movements: Extraocular movements intact.     Conjunctiva/sclera: Conjunctivae normal.     Pupils: Pupils are equal, round, and reactive to light.  Cardiovascular:     Rate and Rhythm: Normal rate and regular rhythm.     Pulses: Normal pulses.     Heart sounds: Normal heart sounds. No murmur heard.  No friction rub. No gallop.  Pulmonary:     Effort: Pulmonary effort is normal.     Breath sounds: Normal breath sounds.  Abdominal:     General: Bowel sounds are normal. There is no distension.     Palpations: Abdomen is soft. There is no mass.     Tenderness: There is no abdominal tenderness. There is no guarding.  Musculoskeletal:        General: No swelling. Normal range of motion.     Cervical back: Normal range of motion and neck supple. No tenderness.     Right lower leg: No edema.     Left lower leg: No edema.  Skin:    General: Skin is warm and dry.     Capillary Refill: Capillary refill takes less than 2 seconds.     Findings: Rash (eczema present) present.  Neurological:     General: No  focal deficit present.     Mental Status: He is alert and oriented to person, place, and time.     Motor: No weakness.     Gait: Gait normal.  Psychiatric:        Mood and Affect: Mood normal.        Behavior: Behavior normal.        Thought Content: Thought content normal.      No results found for any visits on 06/01/24.     Assessment & Plan:    Routine Health Maintenance and Physical Exam  Christian Cardenas was seen today for annual exam.  Diagnoses and all orders for this visit:  Routine general medical examination at a health care facility  Screening for endocrine, metabolic and immunity disorder  Mixed hyperlipidemia  Adult ADHD  Ulcerative pancolitis (HCC)  Chronic left shoulder pain  Chronic pain in left foot  Assessment and Plan    Wellness exam Declined labs.   Mixed HLD.  Declined labs today. He is working with a theme park manager for a more balanced diet.   Screening for endocrine, metabolic, and immunity disorder Declined labs today.   Chronic left foot pain - Continue supportive shoes and heel inserts. - Perform foot exercises with a tennis ball. - Ice and rest foot as needed. - Consider podiatrist referral if pain persists or worsens.  Left shoulder pain Intermittent mild left shoulder pain - Use Tylenol  or ibuprofen for pain. - Apply heat, ice as needed. - Consider lidocaine  patches. - Perform physical therapy exercises. - Follow up for new or worsening symptoms  Adult ADHD ADHD management ongoing - Continue follow up with BH.   UC Stable - Continue follow up with GI.        Immunization History  Administered Date(s) Administered   DTaP 09/02/1993, 11/04/1993, 01/28/1994, 09/09/1994   HIB (PRP-OMP) 09/02/1993, 11/04/1993, 01/28/1994, 09/09/1994   Hepatitis A 12/03/2005, 12/12/2008   Hepatitis B Jan [birth year] 1994, 09/02/1993, 11/04/1993   IPV 09/02/1993, 11/04/1993, 01/28/1994, 06/12/1998   MMR 09/09/1994, 06/12/1998   Moderna Sars-Covid-2  Vaccination 08/31/2019, 10/02/2019   Pneumococcal Conjugate-13 11/15/2017   Pneumococcal Polysaccharide-23 01/03/2018   Tdap 12/03/2005   Varicella 11/03/1998    Health Maintenance  Topic Date Due   Influenza Vaccine  08/28/2024 (Originally 12/30/2023)   DTaP/Tdap/Td (6 - Td or Tdap) 06/01/2025 (Originally 12/04/2015)   Hepatitis B Vaccines 19-59 Average Risk (4 of 4 - 4-dose series) 06/01/2025 (Originally 12/31/1993)   HPV VACCINES (1 - 3-dose SCDM series) 06/01/2025 (Originally 07/03/2020)   COVID-19 Vaccine (3 - 2025-26 season) 06/17/2025 (Originally 01/30/2024)   Hepatitis  C Screening  Completed   HIV Screening  Completed   Pneumococcal Vaccine  Aged Out   Meningococcal B Vaccine  Aged Out    Discussed health benefits of physical activity, and encouraged him to engage in regular exercise appropriate for his age and condition.  Problem List Items Addressed This Visit       Digestive   Ulcerative pancolitis [facility])     Other   Adult ADHD   Other Visit Diagnoses       Routine general medical examination at a health care facility    -  Primary     Screening for endocrine, metabolic and immunity disorder         Mixed hyperlipidemia         Chronic left shoulder pain         Chronic pain in left foot          Return in 1 year (on 06/01/2025).   The patient indicates understanding of these issues and agrees with the plan.  Annabella CHRISTELLA Search, FNP      [1]  Outpatient Medications Prior to Visit  Medication Sig   amphetamine -dextroamphetamine  (ADDERALL) 10 MG tablet Take 1 tablet (10 mg total) by mouth daily with breakfast.   amphetamine -dextroamphetamine  (ADDERALL) 10 MG tablet Take 1 tablet (10 mg total) by mouth daily with breakfast.   amphetamine -dextroamphetamine  (ADDERALL) 10 MG tablet Take 1 tablet (10 mg total) by mouth daily with breakfast.   hydrocortisone  2.5 % cream Apply topically 2 (two) times daily.   Multiple Vitamin (MULTIVITAMIN WITH MINERALS) TABS tablet Take 1  tablet by mouth daily.   saccharomyces boulardii (FLORASTOR) 250 MG capsule Take 250 mg by mouth daily.   triamcinolone  cream (KENALOG ) 0.1 % Apply 1 Application topically daily as needed (for irritation).   XELJANZ XR 11 MG TB24 Take 1 tablet by mouth daily.   No facility-administered medications prior to visit.   "

## 2024-06-01 NOTE — Patient Instructions (Signed)
 Health Maintenance, Male  Adopting a healthy lifestyle and getting preventive care are important in promoting health and wellness. Ask your health care provider about:  The right schedule for you to have regular tests and exams.  Things you can do on your own to prevent diseases and keep yourself healthy.  What should I know about diet, weight, and exercise?  Eat a healthy diet    Eat a diet that includes plenty of vegetables, fruits, low-fat dairy products, and lean protein.  Do not eat a lot of foods that are high in solid fats, added sugars, or sodium.  Maintain a healthy weight  Body mass index (BMI) is a measurement that can be used to identify possible weight problems. It estimates body fat based on height and weight. Your health care provider can help determine your BMI and help you achieve or maintain a healthy weight.  Get regular exercise  Get regular exercise. This is one of the most important things you can do for your health. Most adults should:  Exercise for at least 150 minutes each week. The exercise should increase your heart rate and make you sweat (moderate-intensity exercise).  Do strengthening exercises at least twice a week. This is in addition to the moderate-intensity exercise.  Spend less time sitting. Even light physical activity can be beneficial.  Watch cholesterol and blood lipids  Have your blood tested for lipids and cholesterol at 31 years of age, then have this test every 5 years.  You may need to have your cholesterol levels checked more often if:  Your lipid or cholesterol levels are high.  You are older than 31 years of age.  You are at high risk for heart disease.  What should I know about cancer screening?  Many types of cancers can be detected early and may often be prevented. Depending on your health history and family history, you may need to have cancer screening at various ages. This may include screening for:  Colorectal cancer.  Prostate cancer.  Skin cancer.  Lung  cancer.  What should I know about heart disease, diabetes, and high blood pressure?  Blood pressure and heart disease  High blood pressure causes heart disease and increases the risk of stroke. This is more likely to develop in people who have high blood pressure readings or are overweight.  Talk with your health care provider about your target blood pressure readings.  Have your blood pressure checked:  Every 3-5 years if you are 24-52 years of age.  Every year if you are 3 years old or older.  If you are between the ages of 60 and 72 and are a current or former smoker, ask your health care provider if you should have a one-time screening for abdominal aortic aneurysm (AAA).  Diabetes  Have regular diabetes screenings. This checks your fasting blood sugar level. Have the screening done:  Once every three years after age 66 if you are at a normal weight and have a low risk for diabetes.  More often and at a younger age if you are overweight or have a high risk for diabetes.  What should I know about preventing infection?  Hepatitis B  If you have a higher risk for hepatitis B, you should be screened for this virus. Talk with your health care provider to find out if you are at risk for hepatitis B infection.  Hepatitis C  Blood testing is recommended for:  Everyone born from [birth year] through 1967.  Anyone  with known risk factors for hepatitis C.  Sexually transmitted infections (STIs)  You should be screened each year for STIs, including gonorrhea and chlamydia, if:  You are sexually active and are younger than 31 years of age.  You are older than 31 years of age and your health care provider tells you that you are at risk for this type of infection.  Your sexual activity has changed since you were last screened, and you are at increased risk for chlamydia or gonorrhea. Ask your health care provider if you are at risk.  Ask your health care provider about whether you are at high risk for HIV. Your health care provider  may recommend a prescription medicine to help prevent HIV infection. If you choose to take medicine to prevent HIV, you should first get tested for HIV. You should then be tested every 3 months for as long as you are taking the medicine.  Follow these instructions at home:  Alcohol use  Do not drink alcohol if your health care provider tells you not to drink.  If you drink alcohol:  Limit how much you have to 0-2 drinks a day.  Know how much alcohol is in your drink. In the [place] one drink equals one 12 oz bottle of beer (355 mL), one 5 oz glass of wine (148 mL), or one 1 oz glass of hard liquor (44 mL).  Lifestyle  Do not use any products that contain nicotine or tobacco. These products include cigarettes, chewing tobacco, and vaping devices, such as e-cigarettes. If you need help quitting, ask your health care provider.  Do not use street drugs.  Do not share needles.  Ask your health care provider for help if you need support or information about quitting drugs.  General instructions  Schedule regular health, dental, and eye exams.  Stay current with your vaccines.  Tell your health care provider if:  You often feel depressed.  You have ever been abused or do not feel safe at home.  Summary  Adopting a healthy lifestyle and getting preventive care are important in promoting health and wellness.  Follow your health care provider's instructions about healthy diet, exercising, and getting tested or screened for diseases.  Follow your health care provider's instructions on monitoring your cholesterol and blood pressure.  This information is not intended to replace advice given to you by your health care provider. Make sure you discuss any questions you have with your health care provider.  Document Revised: 10/06/2020 Document Reviewed: 10/06/2020  Elsevier Patient Education  2024 [employer].

## 2024-06-04 ENCOUNTER — Telehealth: Payer: Self-pay | Admitting: Behavioral Health

## 2024-06-04 DIAGNOSIS — F902 Attention-deficit hyperactivity disorder, combined type: Secondary | ICD-10-CM

## 2024-06-04 NOTE — Telephone Encounter (Signed)
 Pt needs rf of Adderall   Appt 10/18/24    [facility] [id number]  [place]

## 2024-06-04 NOTE — Telephone Encounter (Signed)
Pended 3 RF.

## 2024-06-05 MED ORDER — AMPHETAMINE-DEXTROAMPHETAMINE 10 MG PO TABS: 10.0000 mg | ORAL_TABLET | Freq: Every day | ORAL | 0 refills | Status: AC

## 2024-10-18 ENCOUNTER — Ambulatory Visit: Admitting: Behavioral Health

## 2025-06-03 ENCOUNTER — Encounter: Admitting: Family Medicine
